# Patient Record
Sex: Male | Born: 1955 | Race: White | Hispanic: No | Marital: Married | Smoking: Never smoker
Health system: Southern US, Community
[De-identification: ages and names within clinical notes are randomized; demographics above are authoritative.]

## PROBLEM LIST (undated history)

## (undated) DIAGNOSIS — I2510 Atherosclerotic heart disease of native coronary artery without angina pectoris: Secondary | ICD-10-CM

## (undated) DIAGNOSIS — E785 Hyperlipidemia, unspecified: Secondary | ICD-10-CM

## (undated) DIAGNOSIS — I255 Ischemic cardiomyopathy: Secondary | ICD-10-CM

## (undated) DIAGNOSIS — I2109 ST elevation (STEMI) myocardial infarction involving other coronary artery of anterior wall: Secondary | ICD-10-CM

## (undated) DIAGNOSIS — H409 Unspecified glaucoma: Secondary | ICD-10-CM

## (undated) HISTORY — DX: Unspecified glaucoma: H40.9

## (undated) HISTORY — PX: HAND SURGERY: SHX662

## (undated) HISTORY — PX: CARDIAC CATHETERIZATION: SHX172

---
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
## 2014-10-04 ENCOUNTER — Ambulatory Visit (INDEPENDENT_AMBULATORY_CARE_PROVIDER_SITE_OTHER): Payer: BC Managed Care – PPO

## 2014-10-04 VITALS — BP 127/78 | HR 71 | Resp 18

## 2014-10-04 DIAGNOSIS — R52 Pain, unspecified: Secondary | ICD-10-CM

## 2014-10-04 DIAGNOSIS — M722 Plantar fascial fibromatosis: Secondary | ICD-10-CM

## 2014-10-04 DIAGNOSIS — M76821 Posterior tibial tendinitis, right leg: Secondary | ICD-10-CM | POA: Diagnosis not present

## 2014-10-04 DIAGNOSIS — M7731 Calcaneal spur, right foot: Secondary | ICD-10-CM

## 2014-10-04 NOTE — Progress Notes (Signed)
   Subjective:    Patient ID: Samuel [name], male    DOB: 20-Jan-1956, 59 y.o.   MRN: [id number]  HPI i am having some pain in my arch and on bottom of my right heel and has been going on for about a year and throbs and is sore on the bottom some     Review of Systems  HENT: Positive for sinus pressure.   Skin:       Change in nails  All other systems reviewed and are negative.      Objective:   Physical Exam 59 year old white male well-developed well-nourished oriented 3 presents this time with approximately one-year history of foot and arch pain right more severe than left both feet feel painful at times has severe promontory changes with flattening of the foot there is bony prominence of the navicular medially right more so than left. Mild functional limitus of the hallux due to premature he changes in an flattening of the arch on weightbearing. There is pain tenderness on palpation of the medial band plantar fascia and mid arch area right foot from mid arch to calcaneal tubercle there is some tenderness on palpation directly over the navicular itself at the posterior tibial tendon insertion of the navicular. No open wounds no ulcers no other abnormalities masker status is intact pedal pulses are palpable DP and PT +2 over 4 Refill time 3 seconds all digits epicritic and proprioceptive sensations intact and symmetric there is normal plantar response and DTRs. Rays reveal minimal inferior Heel spurring fascial thickening is noted on lateral projection prepped or changes noted clinically and radiographically with anterior break in the cyma line being noted as well on x-ray.       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 Patient may also have some mild posterior tibial tendinitis and capsulitis of the end of the talonavicular joint. Plan at this time patient is placed in a fascial  strapping her right foot both for a trial evaluation for possible use of orthoses in the future. Maintain strapping for 5 days use Tylenol or Advil as needed for pain patient is wearing OTC Dr. Jabier MuttonScholl insoles in his shoes however these are mostly cushion no significant support is been noted indicates he feels better when he's and a firm soled shoe like his carbon fiber bicycle shoe. This is likely due to the support provided by the shoes. Fascial strapping applied reevaluate within 2 weeks may be candidate for orthoses based on progress  Alvan Dameichard Italo Banton DPM

## 2014-10-04 NOTE — Patient Instructions (Signed)
ICE INSTRUCTIONS  Apply ice or cold pack to the affected area at least 3 times a day for 10-15 minutes each time.  You should also use ice after prolonged activity or vigorous exercise.  Do not apply ice longer than 20 minutes at one time.  Always keep a cloth between your skin and the ice pack to prevent burns.  Being consistent and following these instructions will help control your symptoms.  We suggest you purchase a gel ice pack because they are reusable and do bit leak.  Some of them are designed to wrap around the area.  Use the method that works best for you.  Here are some other suggestions for icing.   Use a frozen bag of peas or corn-inexpensive and molds well to your body, usually stays frozen for 10 to 20 minutes.  Wet a towel with cold water and squeeze out the excess until it's damp.  Place in a bag in the freezer for 20 minutes. Then remove and use.  For pain consider using Tylenol or ibuprofen as needed for any pain    Plantar Fasciitis Plantar fasciitis is a common condition that causes foot pain. It is soreness (inflammation) of the band of tough fibrous tissue on the bottom of the foot that runs from the heel bone (calcaneus) to the ball of the foot. The cause of this soreness may be from excessive standing, poor fitting shoes, running on hard surfaces, being overweight, having an abnormal walk, or overuse (this is common in runners) of the painful foot or feet. It is also common in aerobic exercise dancers and ballet dancers. SYMPTOMS  Most people with plantar fasciitis complain of:  Severe pain in the morning on the bottom of their foot especially when taking the first steps out of bed. This pain recedes after a few minutes of walking.  Severe pain is experienced also during walking following a long period of inactivity.  Pain is worse when walking barefoot or up stairs DIAGNOSIS  3. Your caregiver will diagnose this condition by examining and feeling your  foot. 4. Special tests such as X-rays of your foot, are usually not needed. PREVENTION   Consult a sports medicine professional before beginning a new exercise program.  Walking programs offer a good workout. With walking there is a lower chance of overuse injuries common to runners. There is less impact and less jarring of the joints.  Begin all new exercise programs slowly. If problems or pain develop, decrease the amount of time or distance until you are at a comfortable level.  Wear good shoes and replace them regularly.  Stretch your foot and the heel cords at the back of the ankle (Achilles tendon) both before and after exercise.  Run or exercise on even surfaces that are not hard. For example, asphalt is better than pavement.  Do not run barefoot on hard surfaces.  If using a treadmill, vary the incline.  Do not continue to workout if you have foot or joint problems. Seek professional help if they do not improve. HOME CARE INSTRUCTIONS   Avoid activities that cause you pain until you recover.  Use ice or cold packs on the problem or painful areas after working out.  Only take over-the-counter or prescription medicines for pain, discomfort, or fever as directed by your caregiver.  Soft shoe inserts or athletic shoes with air or gel sole cushions may be helpful.  If problems continue or become more severe, consult a sports medicine caregiver or  your own health care provider. Cortisone is a potent anti-inflammatory medication that may be injected into the painful area. You can discuss this treatment with your caregiver. MAKE SURE YOU:   Understand these instructions.  Will watch your condition.  Will get help right away if you are not doing well or get worse. Document Released: 03/24/2001 Document Revised: 09/21/2011 Document Reviewed: 05/23/2008 [facility] Patient Information 2015 [place] [place]. This information is not intended to replace advice given to you by your  health care provider. Make sure you discuss any questions you have with your health care provider.

## 2014-10-22 ENCOUNTER — Ambulatory Visit: Payer: BC Managed Care – PPO

## 2014-10-24 ENCOUNTER — Ambulatory Visit: Payer: BC Managed Care – PPO | Admitting: Podiatry

## 2016-05-07 ENCOUNTER — Ambulatory Visit: Payer: Self-pay | Admitting: Licensed Clinical Social Worker

## 2019-02-12 ENCOUNTER — Other Ambulatory Visit: Payer: Self-pay

## 2019-02-12 ENCOUNTER — Encounter (HOSPITAL_COMMUNITY)
Admission: EM | Disposition: A | Discharge status: Home / Self Care | Payer: Self-pay | Source: Home / Self Care | Attending: Cardiology

## 2019-02-12 ENCOUNTER — Inpatient Hospital Stay (HOSPITAL_COMMUNITY)
Admission: EM | Admit: 2019-02-12 | Discharge: 2019-02-14 | DRG: 247 | Disposition: A | Discharge status: Home / Self Care | Payer: BC Managed Care – PPO | Attending: Cardiology | Admitting: Cardiology

## 2019-02-12 ENCOUNTER — Inpatient Hospital Stay (HOSPITAL_COMMUNITY): Payer: BC Managed Care – PPO

## 2019-02-12 ENCOUNTER — Encounter (HOSPITAL_COMMUNITY): Payer: Self-pay | Admitting: Cardiology

## 2019-02-12 DIAGNOSIS — I255 Ischemic cardiomyopathy: Secondary | ICD-10-CM | POA: Diagnosis present

## 2019-02-12 DIAGNOSIS — Z8249 Family history of ischemic heart disease and other diseases of the circulatory system: Secondary | ICD-10-CM

## 2019-02-12 DIAGNOSIS — Z79899 Other long term (current) drug therapy: Secondary | ICD-10-CM | POA: Diagnosis not present

## 2019-02-12 DIAGNOSIS — I2510 Atherosclerotic heart disease of native coronary artery without angina pectoris: Secondary | ICD-10-CM | POA: Diagnosis not present

## 2019-02-12 DIAGNOSIS — I2109 ST elevation (STEMI) myocardial infarction involving other coronary artery of anterior wall: Secondary | ICD-10-CM

## 2019-02-12 DIAGNOSIS — Z20828 Contact with and (suspected) exposure to other viral communicable diseases: Secondary | ICD-10-CM | POA: Diagnosis present

## 2019-02-12 DIAGNOSIS — I2102 ST elevation (STEMI) myocardial infarction involving left anterior descending coronary artery: Secondary | ICD-10-CM

## 2019-02-12 DIAGNOSIS — N179 Acute kidney failure, unspecified: Secondary | ICD-10-CM | POA: Diagnosis present

## 2019-02-12 DIAGNOSIS — E785 Hyperlipidemia, unspecified: Secondary | ICD-10-CM

## 2019-02-12 HISTORY — DX: Atherosclerotic heart disease of native coronary artery without angina pectoris: I25.10

## 2019-02-12 HISTORY — PX: LEFT HEART CATH AND CORONARY ANGIOGRAPHY: CATH118249

## 2019-02-12 HISTORY — DX: ST elevation (STEMI) myocardial infarction involving other coronary artery of anterior wall: I21.09

## 2019-02-12 HISTORY — PX: CORONARY/GRAFT ACUTE MI REVASCULARIZATION: CATH118305

## 2019-02-12 HISTORY — DX: Hyperlipidemia, unspecified: E78.5

## 2019-02-12 HISTORY — DX: ST elevation (STEMI) myocardial infarction involving left anterior descending coronary artery: I21.02

## 2019-02-12 HISTORY — DX: Ischemic cardiomyopathy: I25.5

## 2019-02-12 LAB — CBC WITH DIFFERENTIAL/PLATELET
Abs Immature Granulocytes: 0.04 10*3/uL (ref 0.00–0.07)
Basophils Absolute: 0 10*3/uL (ref 0.0–0.1)
Basophils Relative: 0 %
Eosinophils Absolute: 0 10*3/uL (ref 0.0–0.5)
Eosinophils Relative: 0 %
HCT: 41.3 % (ref 39.0–52.0)
Hemoglobin: 14.3 g/dL (ref 13.0–17.0)
Immature Granulocytes: 0 %
Lymphocytes Relative: 13 %
Lymphs Abs: 1.9 10*3/uL (ref 0.7–4.0)
MCH: 33.4 pg (ref 26.0–34.0)
MCHC: 34.6 g/dL (ref 30.0–36.0)
MCV: 96.5 fL (ref 80.0–100.0)
Monocytes Absolute: 0.8 10*3/uL (ref 0.1–1.0)
Monocytes Relative: 6 %
Neutro Abs: 11.3 10*3/uL — ABNORMAL HIGH (ref 1.7–7.7)
Neutrophils Relative %: 81 %
Platelets: 286 10*3/uL (ref 150–400)
RBC: 4.28 MIL/uL (ref 4.22–5.81)
RDW: 12.2 % (ref 11.5–15.5)
WBC: 14.1 10*3/uL — ABNORMAL HIGH (ref 4.0–10.5)
nRBC: 0 % (ref 0.0–0.2)

## 2019-02-12 LAB — I-STAT CHEM 8, ED
BUN: 27 mg/dL — ABNORMAL HIGH (ref 8–23)
Calcium, Ion: 1.17 mmol/L (ref 1.15–1.40)
Chloride: 108 mmol/L (ref 98–111)
Creatinine, Ser: 1.3 mg/dL — ABNORMAL HIGH (ref 0.61–1.24)
Glucose, Bld: 170 mg/dL — ABNORMAL HIGH (ref 70–99)
HCT: 41 % (ref 39.0–52.0)
Hemoglobin: 13.9 g/dL (ref 13.0–17.0)
Potassium: 4.6 mmol/L (ref 3.5–5.1)
Sodium: 141 mmol/L (ref 135–145)
TCO2: 20 mmol/L — ABNORMAL LOW (ref 22–32)

## 2019-02-12 LAB — CBC
HCT: 38.4 % — ABNORMAL LOW (ref 39.0–52.0)
Hemoglobin: 13.2 g/dL (ref 13.0–17.0)
MCH: 33.4 pg (ref 26.0–34.0)
MCHC: 34.4 g/dL (ref 30.0–36.0)
MCV: 97.2 fL (ref 80.0–100.0)
Platelets: 220 10*3/uL (ref 150–400)
RBC: 3.95 MIL/uL — ABNORMAL LOW (ref 4.22–5.81)
RDW: 12.2 % (ref 11.5–15.5)
WBC: 17 10*3/uL — ABNORMAL HIGH (ref 4.0–10.5)
nRBC: 0 % (ref 0.0–0.2)

## 2019-02-12 LAB — COMPREHENSIVE METABOLIC PANEL
ALT: 22 U/L (ref 0–44)
AST: 26 U/L (ref 15–41)
Albumin: 4.2 g/dL (ref 3.5–5.0)
Alkaline Phosphatase: 51 U/L (ref 38–126)
Anion gap: 14 (ref 5–15)
BUN: 26 mg/dL — ABNORMAL HIGH (ref 8–23)
CO2: 19 mmol/L — ABNORMAL LOW (ref 22–32)
Calcium: 9.9 mg/dL (ref 8.9–10.3)
Chloride: 106 mmol/L (ref 98–111)
Creatinine, Ser: 1.46 mg/dL — ABNORMAL HIGH (ref 0.61–1.24)
GFR calc Af Amer: 59 mL/min — ABNORMAL LOW (ref >60)
GFR calc non Af Amer: 51 mL/min — ABNORMAL LOW (ref >60)
Glucose, Bld: 175 mg/dL — ABNORMAL HIGH (ref 70–99)
Potassium: 4.7 mmol/L (ref 3.5–5.1)
Sodium: 139 mmol/L (ref 135–145)
Total Bilirubin: 0.7 mg/dL (ref 0.3–1.2)
Total Protein: 7.7 g/dL (ref 6.5–8.1)

## 2019-02-12 LAB — LIPID PANEL
Cholesterol: 162 mg/dL (ref 0–200)
HDL: 55 mg/dL (ref >40)
LDL Cholesterol: 83 mg/dL (ref 0–99)
Total CHOL/HDL Ratio: 2.9 RATIO
Triglycerides: 120 mg/dL (ref <150)
VLDL: 24 mg/dL (ref 0–40)

## 2019-02-12 LAB — TROPONIN I (HIGH SENSITIVITY)
Troponin I (High Sensitivity): 19 ng/L — ABNORMAL HIGH (ref <18)
Troponin I (High Sensitivity): 517 ng/L (ref <18)
Troponin I (High Sensitivity): 2359 ng/L (ref <18)
Troponin I (High Sensitivity): 7269 ng/L (ref <18)
Troponin I (High Sensitivity): 12648 ng/L (ref <18)

## 2019-02-12 LAB — MRSA PCR SCREENING: MRSA by PCR: NEGATIVE

## 2019-02-12 LAB — PROTIME-INR
INR: 1.1 (ref 0.8–1.2)
Prothrombin Time: 14.2 seconds (ref 11.4–15.2)

## 2019-02-12 LAB — CREATININE, SERUM
Creatinine, Ser: 1.35 mg/dL — ABNORMAL HIGH (ref 0.61–1.24)
GFR calc Af Amer: >60 mL/min (ref >60)
GFR calc non Af Amer: 56 mL/min — ABNORMAL LOW (ref >60)

## 2019-02-12 LAB — SARS CORONAVIRUS 2 BY RT PCR (HOSPITAL ORDER, PERFORMED IN ~~LOC~~ HOSPITAL LAB): SARS Coronavirus 2: NEGATIVE

## 2019-02-12 LAB — APTT: aPTT: 23 seconds — ABNORMAL LOW (ref 24–36)

## 2019-02-12 SURGERY — CORONARY/GRAFT ACUTE MI REVASCULARIZATION
Anesthesia: LOCAL

## 2019-02-12 MED ORDER — SODIUM CHLORIDE 0.9 % IV SOLN: 250.0000 mL | INTRAVENOUS | Status: DC | PRN

## 2019-02-12 MED ORDER — CHLORHEXIDINE GLUCONATE CLOTH 2 % EX PADS
6.0000 | MEDICATED_PAD | Freq: Every day | CUTANEOUS | Status: DC
  Administered 2019-02-13: 6 via TOPICAL

## 2019-02-12 MED ORDER — SODIUM CHLORIDE 0.9 % IV SOLN
INTRAVENOUS | Status: DC | PRN
  Administered 2019-02-12: 10 mL/h via INTRAVENOUS
  Administered 2019-02-12: 250 mL

## 2019-02-12 MED ORDER — SODIUM CHLORIDE 0.9% FLUSH: 3.0000 mL | INTRAVENOUS | Status: DC | PRN

## 2019-02-12 MED ORDER — CANGRELOR TETRASODIUM 50 MG IV SOLR
INTRAVENOUS | Status: AC
  Filled 2019-02-12: qty 50

## 2019-02-12 MED ORDER — SODIUM CHLORIDE 0.9 % IV SOLN: INTRAVENOUS | Status: DC

## 2019-02-12 MED ORDER — MIDAZOLAM HCL 2 MG/2ML IJ SOLN
INTRAMUSCULAR | Status: DC | PRN
  Administered 2019-02-12: 0.5 mg via INTRAVENOUS

## 2019-02-12 MED ORDER — MORPHINE SULFATE (PF) 2 MG/ML IV SOLN: 2.0000 mg | INTRAVENOUS | Status: DC | PRN

## 2019-02-12 MED ORDER — MORPHINE SULFATE (PF) 10 MG/ML IV SOLN: 2.0000 mg | INTRAVENOUS | Status: DC | PRN

## 2019-02-12 MED ORDER — ONDANSETRON HCL 4 MG/2ML IJ SOLN: 4.0000 mg | Freq: Four times a day (QID) | INTRAMUSCULAR | Status: DC | PRN

## 2019-02-12 MED ORDER — TICAGRELOR 90 MG PO TABS
ORAL_TABLET | ORAL | Status: DC | PRN
  Administered 2019-02-12: 180 mg via ORAL

## 2019-02-12 MED ORDER — TICAGRELOR 90 MG PO TABS
90.0000 mg | ORAL_TABLET | Freq: Two times a day (BID) | ORAL | Status: DC
  Administered 2019-02-12 – 2019-02-14 (×4): 90 mg via ORAL
  Filled 2019-02-12 (×3): qty 1

## 2019-02-12 MED ORDER — SODIUM CHLORIDE 0.9 % IV SOLN
INTRAVENOUS | Status: AC | PRN
  Administered 2019-02-12: 13:00:00 4 ug/kg/min via INTRAVENOUS

## 2019-02-12 MED ORDER — HEPARIN (PORCINE) IN NACL 1000-0.9 UT/500ML-% IV SOLN
INTRAVENOUS | Status: DC | PRN
  Administered 2019-02-12 (×2): 500 mL

## 2019-02-12 MED ORDER — ATORVASTATIN CALCIUM 80 MG PO TABS
80.0000 mg | ORAL_TABLET | Freq: Every day | ORAL | Status: DC
  Administered 2019-02-12 – 2019-02-14 (×3): 80 mg via ORAL
  Filled 2019-02-12 (×3): qty 1

## 2019-02-12 MED ORDER — LIDOCAINE HCL (PF) 1 % IJ SOLN
INTRAMUSCULAR | Status: DC | PRN
  Administered 2019-02-12: 2 mL via SUBCUTANEOUS

## 2019-02-12 MED ORDER — VERAPAMIL HCL 2.5 MG/ML IV SOLN
INTRAVENOUS | Status: DC | PRN
  Administered 2019-02-12: 10 mL via INTRA_ARTERIAL

## 2019-02-12 MED ORDER — NITROGLYCERIN 1 MG/10 ML FOR IR/CATH LAB
INTRA_ARTERIAL | Status: DC | PRN
  Administered 2019-02-12: 200 ug via INTRACORONARY

## 2019-02-12 MED ORDER — ACETAMINOPHEN 325 MG PO TABS: 650.0000 mg | ORAL_TABLET | ORAL | Status: DC | PRN

## 2019-02-12 MED ORDER — SODIUM CHLORIDE 0.9 % IV SOLN
4.0000 ug/kg/min | INTRAVENOUS | Status: AC
  Filled 2019-02-12: qty 50

## 2019-02-12 MED ORDER — LIDOCAINE HCL (PF) 1 % IJ SOLN
INTRAMUSCULAR | Status: AC
  Filled 2019-02-12: qty 30

## 2019-02-12 MED ORDER — NITROGLYCERIN 0.4 MG SL SUBL
0.4000 mg | SUBLINGUAL_TABLET | SUBLINGUAL | Status: DC | PRN
  Administered 2019-02-12: 0.4 mg via SUBLINGUAL
  Filled 2019-02-12: qty 1

## 2019-02-12 MED ORDER — HEPARIN SODIUM (PORCINE) 1000 UNIT/ML IJ SOLN
INTRAMUSCULAR | Status: DC | PRN
  Administered 2019-02-12: 3000 [IU] via INTRAVENOUS
  Administered 2019-02-12: 4000 [IU] via INTRAVENOUS
  Administered 2019-02-12: 2000 [IU] via INTRAVENOUS

## 2019-02-12 MED ORDER — HYDROMORPHONE HCL 1 MG/ML IJ SOLN
0.5000 mg | Freq: Once | INTRAMUSCULAR | Status: AC
  Administered 2019-02-12: 0.5 mg via INTRAVENOUS
  Filled 2019-02-12: qty 1

## 2019-02-12 MED ORDER — TICAGRELOR 90 MG PO TABS
ORAL_TABLET | ORAL | Status: AC
  Filled 2019-02-12: qty 2

## 2019-02-12 MED ORDER — MIDAZOLAM HCL 2 MG/2ML IJ SOLN
INTRAMUSCULAR | Status: AC
  Filled 2019-02-12: qty 2

## 2019-02-12 MED ORDER — CANGRELOR BOLUS VIA INFUSION
INTRAVENOUS | Status: DC | PRN
  Administered 2019-02-12: 2124 ug via INTRAVENOUS

## 2019-02-12 MED ORDER — HEPARIN SODIUM (PORCINE) 1000 UNIT/ML IJ SOLN
INTRAMUSCULAR | Status: AC
  Filled 2019-02-12: qty 1

## 2019-02-12 MED ORDER — HEPARIN (PORCINE) IN NACL 1000-0.9 UT/500ML-% IV SOLN
INTRAVENOUS | Status: AC
  Filled 2019-02-12: qty 1000

## 2019-02-12 MED ORDER — SODIUM CHLORIDE 0.9% FLUSH
3.0000 mL | Freq: Two times a day (BID) | INTRAVENOUS | Status: DC
  Administered 2019-02-12 – 2019-02-14 (×4): 3 mL via INTRAVENOUS

## 2019-02-12 MED ORDER — SODIUM CHLORIDE 0.9 % IV SOLN
INTRAVENOUS | Status: AC
  Administered 2019-02-12: 14:00:00 via INTRAVENOUS

## 2019-02-12 MED ORDER — ASPIRIN 81 MG PO CHEW
81.0000 mg | CHEWABLE_TABLET | Freq: Every day | ORAL | Status: DC
  Administered 2019-02-13 – 2019-02-14 (×2): 81 mg via ORAL
  Filled 2019-02-12 (×2): qty 1

## 2019-02-12 MED ORDER — HEPARIN SODIUM (PORCINE) 5000 UNIT/ML IJ SOLN
60.0000 [IU]/kg | Freq: Once | INTRAMUSCULAR | Status: AC
  Administered 2019-02-12: 12:00:00 4000 [IU] via INTRAVENOUS

## 2019-02-12 MED ORDER — IOHEXOL 350 MG/ML SOLN
INTRAVENOUS | Status: DC | PRN
  Administered 2019-02-12: 110 mL via INTRA_ARTERIAL

## 2019-02-12 MED ORDER — METOPROLOL TARTRATE 12.5 MG HALF TABLET
12.5000 mg | ORAL_TABLET | Freq: Two times a day (BID) | ORAL | Status: DC
  Administered 2019-02-12 – 2019-02-14 (×5): 12.5 mg via ORAL
  Filled 2019-02-12 (×5): qty 1

## 2019-02-12 MED ORDER — HEPARIN SODIUM (PORCINE) 5000 UNIT/ML IJ SOLN
5000.0000 [IU] | Freq: Three times a day (TID) | INTRAMUSCULAR | Status: DC
  Administered 2019-02-13 – 2019-02-14 (×5): 5000 [IU] via SUBCUTANEOUS
  Filled 2019-02-12 (×5): qty 1

## 2019-02-12 MED ORDER — HYDRALAZINE HCL 20 MG/ML IJ SOLN: 10.0000 mg | INTRAMUSCULAR | Status: AC | PRN

## 2019-02-12 MED ORDER — NITROGLYCERIN 1 MG/10 ML FOR IR/CATH LAB
INTRA_ARTERIAL | Status: AC
  Filled 2019-02-12: qty 10

## 2019-02-12 MED ORDER — LABETALOL HCL 5 MG/ML IV SOLN: 10.0000 mg | INTRAVENOUS | Status: AC | PRN

## 2019-02-12 MED ORDER — VERAPAMIL HCL 2.5 MG/ML IV SOLN
INTRAVENOUS | Status: AC
  Filled 2019-02-12: qty 2

## 2019-02-12 SURGICAL SUPPLY — 17 items
BAG SNAP BAND KOVER 36X36 (MISCELLANEOUS) ×2 IMPLANT
BALLN SAPPHIRE 2.5X15 (BALLOONS) ×2
BALLOON SAPPHIRE 2.5X15 (BALLOONS) ×1 IMPLANT
CATH INFINITI 5FR ANG PIGTAIL (CATHETERS) ×2 IMPLANT
CATH OPTITORQUE TIG 4.0 5F (CATHETERS) ×2 IMPLANT
CATH VISTA GUIDE 6FR XBLAD3.5 (CATHETERS) ×2 IMPLANT
DEVICE RAD COMP TR BAND LRG (VASCULAR PRODUCTS) ×2 IMPLANT
GLIDESHEATH SLEND A-KIT 6F 22G (SHEATH) ×2 IMPLANT
GUIDEWIRE INQWIRE 1.5J.035X260 (WIRE) ×1 IMPLANT
INQWIRE 1.5J .035X260CM (WIRE) ×2
KIT ENCORE 26 ADVANTAGE (KITS) ×2 IMPLANT
KIT HEART LEFT (KITS) ×2 IMPLANT
PACK CARDIAC CATHETERIZATION (CUSTOM PROCEDURE TRAY) ×2 IMPLANT
STENT RESOLUTE ONYX 2.75X18 (Permanent Stent) ×2 IMPLANT
TRANSDUCER W/STOPCOCK (MISCELLANEOUS) ×2 IMPLANT
TUBING CIL FLEX 10 FLL-RA (TUBING) ×2 IMPLANT
WIRE ASAHI PROWATER 180CM (WIRE) ×2 IMPLANT

## 2019-02-12 NOTE — H&P (Signed)
Cardiology Admission History and Physical:   Patient ID: Samuel [name] MRN: [id number]; DOB: 1955-10-02   Admission date: 02/12/2019  Primary Care Provider: No primary care provider on file. Primary Cardiologist: No primary care provider on file.  New to see [facility] Primary Electrophysiologist:  None   Chief Complaint: Chest pain-anterior STEMI  Patient Profile:   Samuel [name] is a 63 y.o. male with history of relatively controlled hyperlipidemia for which he takes a low-dose of a statin.  [facility].  History of Present Illness:   Samuel [name] was in his usual state of health having just returned from a 30 mile bike ride (he takes his rides at least twice a week).  Shortly after getting home he developed crushing 8-9/10 chest discomfort and tightness in the left upper chest associated with some diaphoresis and dyspnea.  Within 15 minutes he contacted EMS and upon arrival EKG demonstrated sinus tachycardia at 104 bpm with 3 mm elations in V2 was well as seven 8 mm ST lesions in V2 through V4 and less pronounced elevations in I, aVL as well as V2 by 3 V6.  Mild reciprocal changes noted in III and aVF. Code STEMI was called. The patient received aspirin in route to the hospital.  Chest discomfort was 7 out of 10 on arrival.  In the ER he received 4000 units of IV heparin, 0.5 mg of IV Dilaudid and 0.4 mg sublingual nitroglycerin.  Pain was still roughly 6/10 upon arrival to the Cath Lab.  The patient is practicing social distancing.  COVID-19 (SARS 2 CORONAVIRUS) swab was performed in the ER  Heart Pathway Score:     No past medical history on file.  Past Surgical History:  Procedure Laterality Date  . HAND SURGERY     right     Medications Prior to Admission: Prior to Admission medications   Medication Sig Start Date End Date Taking? Authorizing Provider  ADVICOR 500-20 MG 24 hr tablet  08/09/14   [provider]  Fexofenadine HCl (ALLEGRA PO) Take by  mouth.    [provider]  Fexofenadine HCl (MUCINEX ALLERGY PO) Take by mouth.    [provider]  itraconazole (SPORANOX) 100 MG capsule  09/18/14   [provider]     Allergies:    Allergies  Allergen Reactions  . Sulfa Antibiotics     Social History:   Social History   Tobacco Use  . Smoking status: Never Smoker  . Smokeless tobacco: Never Used  Substance Use Topics  . Alcohol use: No    Alcohol/week: 0.0 standard drinks  . Drug use: No   Social History   Social History Narrative   Married   Patient is very active, he walks usually 2 to 4 miles a day, and bikes 20 to 30 miles 2 times a week.   Non-smoker     Family History: Details of his history fully known, however his father and at least 2 paternal uncles had premature cardiac disease. The patient's family history includes CAD in his father and paternal uncle.    ROS:  Please see the history of present illness.  Prior to today, he had no symptoms of chest pain chest pressure or dyspnea.  No coughing or wheezing.  No lightheadedness, dizziness or wooziness.  No syncope/near syncope.  No TIA or amaurosis fugax. The patient does not have symptoms concerning for COVID-19 infection (fever, chills, cough, or new shortness of breath).  All other ROS reviewed and negative.     Physical Exam/Data:   Vitals:   02/12/19 1305 02/12/19 1310 02/12/19 1315 02/12/19 1320  BP: 103/69 112/62 (!) 102/59 (!) 100/57  Pulse: 78 63 74 78  Resp: 16 19 20 19   Temp:      TempSrc:      SpO2: 96% 97% 97% 100%  Weight:      Height:       No intake or output data in the 24 hours ending 02/12/19 1329 Last 3 Weights 02/12/2019  Weight (lbs) 156 lb  Weight (kg) 70.761 kg     Body mass index is 24.43 kg/m.  General:  Well nourished, well developed, in moderate distress HEENT: normal Lymph: no adenopathy Neck: no JVD or carotid bruit Endocrine:  No thryomegaly Vascular: No carotid bruits; FA pulses 2+  bilaterally without bruits  Cardiac:  normal S1, S2; RRR; no murmur or rubs, cannot exclude S4 gallop Lungs:  clear to auscultation bilaterally, no wheezing, rhonchi or rales  Abd: soft, nontender, no hepatomegaly  Ext: no clubbing/cyanosis or edema Musculoskeletal:  No deformities, BUE and BLE strength normal and equal Skin: warm and dry  Neuro:  CNs 2-12 intact, no focal abnormalities noted Psych:  Normal affect    EKG:  The ECG that was done via EMS was personally reviewed and demonstrates sinus tachycardia rate 100-104 with 6 to 8 mm ST elevations in V2 through V4 with less prominent ST elevations in V1, V5 and 6 as well as 8 criminals 1 and aVL.  Depressions in III and aVF. -->  Consistent with anterior STEMI  Relevant CV Studies: None  Laboratory Data:  High Sensitivity Troponin:  No results for input(s): TROPONINIHS in the last 720 hours.    Cardiac EnzymesNo results for input(s): TROPONINI in the last 168 hours. No results for input(s): TROPIPOC in the last 168 hours.  Chemistry Recent Labs  Lab 02/12/19 1233  NA 141  K 4.6  CL 108  GLUCOSE 170*  BUN 27*  CREATININE 1.30*    No results for input(s): PROT, ALBUMIN, AST, ALT, ALKPHOS, BILITOT in the last 168 hours. Hematology Recent Labs  Lab 02/12/19 1231 02/12/19 1233  WBC 14.1*  --   RBC 4.28  --   HGB 14.3 13.9  HCT 41.3 41.0  MCV 96.5  --   MCH 33.4  --   MCHC 34.6  --   RDW 12.2  --   PLT 286  --    BNPNo results for input(s): BNP, PROBNP in the last 168 hours.  DDimer No results for input(s): DDIMER in the last 168 hours.   Radiology/Studies:  No results found.  Assessment and Plan:   Principal Problem:   Acute ST elevation myocardial infarction (STEMI) of anterior wall (HCC) Active Problems:   Hyperlipidemia with target LDL less than 70  With acute onset chest pain 1 hour prior to arrival to the emergency room and ongoing chest pain will take directly to Cath Lab for emergent cardiac  catheterization and possible PCI.  We will likely start beta-blocker and continue statin at higher dose.  Further plans based on results of cath.  Severity of Illness: The appropriate patient status for this patient is INPATIENT. Inpatient status is judged to be reasonable and necessary in order to provide the required intensity of service to ensure the patient's safety. The patient's presenting symptoms, physical exam findings, and initial radiographic and laboratory data in the context of their chronic comorbidities  is felt to place them at high risk for further clinical deterioration. Furthermore, it is not anticipated that the patient will be medically stable for discharge from the hospital within 2 midnights of admission. The following factors support the patient status of inpatient.   " The patient's presenting symptoms include significant chest pain with diaphoresis and dyspnea. " The worrisome physical exam findings include - appears to be in moderate distress.Reubin Milan exclude S4 " The initial radiographic and laboratory data are worrisome because of EKG suggestive of ST elevations in anterior leads consistent with anterior MI. " The chronic co-morbidities include hyperlipidemia.   * I certify that at the point of admission it is my clinical judgment that the patient will require inpatient hospital care spanning beyond 2 midnights from the point of admission due to high intensity of service, high risk for further deterioration and high frequency of surveillance required.*    For questions or updates, please contact [place] Please consult [email or web address] for contact info under        Signed, Glenetta Hew, MD  02/12/2019 1:29 PM

## 2019-02-12 NOTE — ED Notes (Signed)
4000 heparin bolus given

## 2019-02-12 NOTE — Progress Notes (Signed)
CRITICAL VALUE ALERT  Critical Value:  Troponin 517  Date & Time Notied: 02/12/2019 @ 1510  Provider Notified: NONE  Orders Received/Actions taken: NONE: these results were expected.

## 2019-02-12 NOTE — ED Provider Notes (Signed)
[facility] Provider Note MRN:  [id number]  Arrival date & time: 02/12/19     Chief Complaint   Code STEMI   History of Present Illness   Samuel [name] is a 63 y.o. year-old male with a history of hyperlipidemia presenting to the ED with chief complaint of code STEMI.  Patient had just returned home from a bike ride when he experienced sudden onset left-sided chest pain, described as a pressure, 8 out of 10 in severity, constant.  Associated with diaphoresis, mild shortness of breath.  EKG in the field read as STEMI by EMS, alerted prior to arrival.  Review of Systems  A complete 10 system review of systems was obtained and all systems are negative except as noted in the HPI and PMH.   Patient's Health History   No past medical history on file.  Past Surgical History:  Procedure Laterality Date  . HAND SURGERY     right    Family History  Problem Relation Age of Onset  . CAD Father 5750       Ended up with CABG  . CAD Paternal Uncle     Social History   Socioeconomic History  . Marital status: Unknown    Spouse name: Not on file  . Number of children: Not on file  . Years of education: Not on file  . Highest education level: Not on file  Occupational History  . Not on file  Social Needs  . Financial resource strain: Not on file  . Food insecurity    Worry: Not on file    Inability: Not on file  . Transportation needs    Medical: Not on file    Non-medical: Not on file  Tobacco Use  . Smoking status: Never Smoker  . Smokeless tobacco: Never Used  Substance and Sexual Activity  . Alcohol use: No    Alcohol/week: 0.0 standard drinks  . Drug use: No  . Sexual activity: Not on file  Lifestyle  . Physical activity    Days per week: Not on file    Minutes per session: Not on file  . Stress: Not on file  Relationships  . Social Musicianconnections    Talks on phone: Not on file    Gets together: Not on file    Attends religious  service: Not on file    Active member of club or organization: Not on file    Attends meetings of clubs or organizations: Not on file    Relationship status: Not on file  . Intimate partner violence    Fear of current or ex partner: Not on file    Emotionally abused: Not on file    Physically abused: Not on file    Forced sexual activity: Not on file  Other Topics Concern  . Not on file  Social History Narrative   Married   Patient is very active, he walks usually 2 to 4 miles a day, and bikes 20 to 30 miles 2 times a week.   Non-smoker     Physical Exam  Vital Signs and Nursing Notes reviewed Vitals:   02/12/19 1315 02/12/19 1320  BP: (!) 102/59 (!) 100/57  Pulse: 74 78  Resp: 20 19  Temp:    SpO2: 97% 100%    CONSTITUTIONAL: Ill-appearing, diaphoretic, in mild distress due to pain NEURO:  Alert and oriented x 3, no focal deficits EYES:  eyes equal and reactive ENT/NECK:  no LAD, no JVD  CARDIO: Regular rate, well-perfused, normal S1 and S2 PULM:  CTAB no wheezing or rhonchi GI/GU:  normal bowel sounds, non-distended, non-tender MSK/SPINE:  No gross deformities, no edema SKIN:  no rash, atraumatic PSYCH:  Appropriate speech and behavior  Diagnostic and Interventional Summary    EKG Interpretation  Date/Time:  Sunday February 12 2019 12:26:17 EDT Ventricular Rate:  71 PR Interval:    QRS Duration: 106 QT Interval:  429 QTC Calculation: 467 R Axis:   65 Text Interpretation:  Sinus rhythm Probable left atrial enlargement Low voltage, precordial leads Borderline ST elevation, anterior leads Confirmed by Kennis CarinaBero, Danisha Brassfield [phone]([id number]) on 02/12/2019 1:29:09 PM      Labs Reviewed  CBC WITH DIFFERENTIAL/PLATELET - Abnormal; Notable for the following components:      Result Value   WBC 14.1 (*)    Neutro Abs 11.3 (*)    All other components within normal limits  APTT - Abnormal; Notable for the following components:   aPTT 23 (*)    All other components within normal limits   I-STAT CHEM 8, ED - Abnormal; Notable for the following components:   BUN 27 (*)    Creatinine, Ser 1.30 (*)    Glucose, Bld 170 (*)    TCO2 20 (*)    All other components within normal limits  SARS CORONAVIRUS 2 (HOSPITAL ORDER, PERFORMED IN [facility])  PROTIME-INR  COMPREHENSIVE METABOLIC PANEL  LIPID PANEL  TROPONIN I (HIGH SENSITIVITY)    DG Chest Port 1 View    (Results Pending)    Medications  0.9 %  sodium chloride infusion (has no administration in time range)  nitroGLYCERIN (NITROSTAT) SL tablet 0.4 mg ( Sublingual MAR Hold 02/12/19 1238)  lidocaine (PF) (XYLOCAINE) 1 % injection (2 mLs Subcutaneous Given 02/12/19 1250)  Radial Cocktail/Verapamil only (10 mLs Intra-arterial Given 02/12/19 1252)  midazolam (VERSED) injection (0.5 mg Intravenous Given 02/12/19 1249)  heparin injection (2,000 Units Intravenous Given 02/12/19 1323)  cangrelor (KENGREAL) bolus via infusion (2,124 mcg Intravenous Given 02/12/19 1301)  cangrelor (KENGREAL) 50,000 mcg in sodium chloride 0.9 % 250 mL (200 mcg/mL) infusion (4 mcg/kg/min  70.8 kg Intravenous New Bag/Given 02/12/19 1302)  nitroGLYCERIN 1 mg/10 mL (100 mcg/mL) - IR/CATH LAB (200 mcg Intracoronary Given 02/12/19 1312)  0.9 %  sodium chloride infusion (  Stopped 02/12/19 1329)  iohexol (OMNIPAQUE) 350 MG/ML injection (110 mLs Intra-arterial Given 02/12/19 1327)  Heparin (Porcine) in NaCl 1000-0.9 UT/500ML-% SOLN (500 mLs  Given 02/12/19 1327)  heparin injection 60 Units/kg (4,000 Units Intravenous Given 02/12/19 1225)  HYDROmorphone (DILAUDID) injection 0.5 mg (0.5 mg Intravenous Given 02/12/19 1233)  verapamil (ISOPTIN) 2.5 MG/ML injection (has no administration in time range)  heparin 1000 UNIT/ML injection (has no administration in time range)  lidocaine (PF) (XYLOCAINE) 1 % injection (has no administration in time range)  Heparin (Porcine) in NaCl 1000-0.9 UT/500ML-% SOLN (has no administration in time range)  nitroGLYCERIN 100 mcg/mL  intra-arterial injection (has no administration in time range)  midazolam (VERSED) 2 MG/2ML injection (has no administration in time range)  cangrelor (KENGREAL) 50 MG SOLR (has no administration in time range)     Procedures Critical Care Critical Care Documentation Critical care time provided by me (excluding procedures): 33 minutes  Condition necessitating critical care: ST elevation myocardial infarction  Components of critical care management: reviewing of prior records, laboratory and imaging interpretation, frequent re-examination and reassessment of vital signs, administration of IV heparin, nitroglycerin, IV Dilaudid, discussion with consulting services.  ED Course and Medical Decision Making  I have reviewed the triage vital signs and the nursing notes.  Pertinent labs & imaging results that were available during my care of the patient were reviewed by me and considered in my medical decision making (see below for details).  Clinical picture and out of hospital EKG consistent with ST elevation myocardial infarction involving the LAD.  EKG here improving, however still very much appropriate for intervention.  Brought to the catheterization lab for further care.  Barth Kirks. Sedonia Small, MD [place] [email or web address]  Final Clinical Impressions(s) / ED Diagnoses     ICD-10-CM   1. ST elevation myocardial infarction involving left anterior descending (LAD) coronary artery [facility])  I21.02     ED Discharge Orders    None         Maudie Flakes, MD 02/12/19 1332

## 2019-02-12 NOTE — Progress Notes (Signed)
   02/12/19 1400  Clinical Encounter Type  Visited With Health care provider  Visit Type Initial;Code  Referral From Other (Comment) (STEMI page)   Called ED to ck about pt, per ED bridge, had not arrived yet.  Myra Gianotti resident, [phone]

## 2019-02-12 NOTE — ED Triage Notes (Signed)
Pt arrives via EMS with reports of riding 33 miles on bike. Left sided CP began after stopping with radiation to left arm and jaw. 324 ASA given by EMS.

## 2019-02-12 NOTE — Progress Notes (Signed)
   02/12/19 1402  Clinical Encounter Type  Visited With Family;Health care provider  Visit Type Follow-up;Patient in surgery  Referral From Other (Comment) (ED)  Spiritual Encounters  Spiritual Needs Emotional  Stress Factors  Family Stress Factors Loss of control   ED asked to accompany pt's spouse to [facility] waiting room while pt in cath lab.  Met spouse, she talked about how active pt was, but knew his family had hx of heart problems even though pt didn't.  Assured her I would be in prayer for pt, as she asked.  Let CL know spouse in [place] waiting rm.  Myra Gianotti resident, [phone]

## 2019-02-13 ENCOUNTER — Encounter (HOSPITAL_COMMUNITY): Payer: Self-pay | Admitting: Cardiology

## 2019-02-13 ENCOUNTER — Inpatient Hospital Stay (HOSPITAL_COMMUNITY): Payer: BC Managed Care – PPO

## 2019-02-13 DIAGNOSIS — I2102 ST elevation (STEMI) myocardial infarction involving left anterior descending coronary artery: Secondary | ICD-10-CM

## 2019-02-13 LAB — POCT ACTIVATED CLOTTING TIME
Activated Clotting Time: 241 seconds
Activated Clotting Time: 246 seconds

## 2019-02-13 LAB — ECHOCARDIOGRAM COMPLETE
Height: 67 in
Weight: 2550.28 oz

## 2019-02-13 MED ORDER — LORATADINE 10 MG PO TABS: 10.0000 mg | ORAL_TABLET | Freq: Every day | ORAL | Status: DC

## 2019-02-13 MED ORDER — FLUTICASONE PROPIONATE 50 MCG/ACT NA SUSP
1.0000 | Freq: Two times a day (BID) | NASAL | Status: DC | PRN
  Filled 2019-02-13: qty 16

## 2019-02-13 NOTE — Progress Notes (Signed)
Progress Note  Patient Name: Tywon Niday Date of Encounter: 02/13/2019  Primary Cardiologist: No primary care provider on file. Dr. Ellyn Hack  Subjective   No chest pain.  He feels well.   Inpatient Medications    Scheduled Meds: . aspirin  81 mg Oral Daily  . atorvastatin  80 mg Oral q1800  . Chlorhexidine Gluconate Cloth  6 each Topical Q0600  . heparin  5,000 Units Subcutaneous Q8H  . metoprolol tartrate  12.5 mg Oral BID  . sodium chloride flush  3 mL Intravenous Q12H  . ticagrelor  90 mg Oral BID   Continuous Infusions: . sodium chloride     PRN Meds: sodium chloride, acetaminophen, morphine injection, ondansetron (ZOFRAN) IV, sodium chloride flush   Vital Signs    Vitals:   02/13/19 0900 02/13/19 0954 02/13/19 1000 02/13/19 1100  BP: 121/62     Pulse:  77    Resp: 19  20   Temp:    97.8 F (36.6 C)  TempSrc:    Oral  SpO2: 98%     Weight:      Height:        Intake/Output Summary (Last 24 hours) at 02/13/2019 1128 Last data filed at 02/13/2019 0600 Gross per 24 hour  Intake 1723.2 ml  Output 450 ml  Net 1273.2 ml   Last 3 Weights 02/13/2019 02/12/2019  Weight (lbs) 159 lb 6.3 oz 156 lb  Weight (kg) 72.3 kg 70.761 kg      Telemetry    NSR - Personally Reviewed  ECG    post PCI ECG improved. - Personally Reviewed  Physical Exam   GEN: No acute distress.   Neck: No JVD Cardiac: RRR, no murmurs, rubs, or gallops.  Respiratory: Clear to auscultation bilaterally. GI: Soft, nontender, non-distended  MS: No edema; No deformity.  2+ right radial pulse.  No hematoma Neuro:  Nonfocal  Psych: Normal affect   Labs    High Sensitivity Troponin:   Recent Labs  Lab 02/12/19 1231 02/12/19 1417 02/12/19 1608 02/12/19 1832 02/12/19 2039  TROPONINIHS 19* 517* 2,359* 7,269* 12,648*      Cardiac EnzymesNo results for input(s): TROPONINI in the last 168 hours. No results for input(s): TROPIPOC in the last 168 hours.   Chemistry Recent Labs  Lab 02/12/19  1231 02/12/19 1233 02/12/19 1417  NA 139 141  --   K 4.7 4.6  --   CL 106 108  --   CO2 19*  --   --   GLUCOSE 175* 170*  --   BUN 26* 27*  --   CREATININE 1.46* 1.30* 1.35*  CALCIUM 9.9  --   --   PROT 7.7  --   --   ALBUMIN 4.2  --   --   AST 26  --   --   ALT 22  --   --   ALKPHOS 51  --   --   BILITOT 0.7  --   --   GFRNONAA 51*  --  56*  GFRAA 59*  --  >60  ANIONGAP 14  --   --      Hematology Recent Labs  Lab 02/12/19 1231 02/12/19 1233 02/12/19 1417  WBC 14.1*  --  17.0*  RBC 4.28  --  3.95*  HGB 14.3 13.9 13.2  HCT 41.3 41.0 38.4*  MCV 96.5  --  97.2  MCH 33.4  --  33.4  MCHC 34.6  --  34.4  RDW 12.2  --  12.2  PLT 286  --  220    BNPNo results for input(s): BNP, PROBNP in the last 168 hours.   DDimer No results for input(s): DDIMER in the last 168 hours.   Radiology    Dg Chest Port 1 View  Result Date: 02/12/2019 CLINICAL DATA:  Acute ST elevation myocardial infarction (STEMI) involving left anterior descending (LAD) coronary artery without development of Q waves (HCC) EXAM: PORTABLE CHEST 1 VIEW COMPARISON:  None. FINDINGS: Normal heart size. Clear lung fields. No bony abnormality. IMPRESSION: No active disease. Electronically Signed   By: Elsie StainJohn T Curnes M.D.   On: 02/12/2019 14:37    Cardiac Studies   Cath films personally reviewed  Patient Profile     63 y.o. male s/p anterior MI  Assessment & Plan    1) CAD/Anterior MI: COntinue DAPT along with high dose statin.  Explained rationale of high dose statin.  He is in agreement to take the higher dose.   2) Plan to transfer to tele. Possible discharge in AM.   Renal function stable.   For questions or updates, please contact [facility] Please consult [email or web address] for contact info under        Signed, Lance MussJayadeep Mabel Roll, MD  02/13/2019, 11:28 AM

## 2019-02-13 NOTE — Progress Notes (Signed)
  Echocardiogram 2D Echocardiogram has been performed.  Samuel [name] 02/13/2019, 4:44 PM

## 2019-02-14 ENCOUNTER — Encounter (HOSPITAL_COMMUNITY): Payer: Self-pay | Admitting: *Deleted

## 2019-02-14 DIAGNOSIS — Z8249 Family history of ischemic heart disease and other diseases of the circulatory system: Secondary | ICD-10-CM

## 2019-02-14 DIAGNOSIS — J302 Other seasonal allergic rhinitis: Secondary | ICD-10-CM

## 2019-02-14 HISTORY — DX: Other seasonal allergic rhinitis: J30.2

## 2019-02-14 LAB — BASIC METABOLIC PANEL
Anion gap: 9 (ref 5–15)
BUN: 15 mg/dL (ref 8–23)
CO2: 23 mmol/L (ref 22–32)
Calcium: 9.1 mg/dL (ref 8.9–10.3)
Chloride: 107 mmol/L (ref 98–111)
Creatinine, Ser: 0.96 mg/dL (ref 0.61–1.24)
GFR calc Af Amer: >60 mL/min (ref >60)
GFR calc non Af Amer: >60 mL/min (ref >60)
Glucose, Bld: 111 mg/dL — ABNORMAL HIGH (ref 70–99)
Potassium: 3.9 mmol/L (ref 3.5–5.1)
Sodium: 139 mmol/L (ref 135–145)

## 2019-02-14 MED ORDER — ATORVASTATIN CALCIUM 80 MG PO TABS: 80.0000 mg | ORAL_TABLET | Freq: Every day | ORAL | 6 refills | Status: DC

## 2019-02-14 MED ORDER — METOPROLOL TARTRATE 25 MG PO TABS: 12.5000 mg | ORAL_TABLET | Freq: Two times a day (BID) | ORAL | 6 refills | Status: DC

## 2019-02-14 MED ORDER — TICAGRELOR 90 MG PO TABS: 90.0000 mg | ORAL_TABLET | Freq: Two times a day (BID) | ORAL | 11 refills | Status: DC

## 2019-02-14 MED ORDER — LISINOPRIL 2.5 MG PO TABS: 2.5000 mg | ORAL_TABLET | Freq: Every day | ORAL | 11 refills | Status: DC

## 2019-02-14 MED ORDER — NITROGLYCERIN 0.4 MG SL SUBL: 0.4000 mg | SUBLINGUAL_TABLET | SUBLINGUAL | 12 refills | Status: DC | PRN

## 2019-02-14 MED ORDER — ASPIRIN 81 MG PO CHEW: 81.0000 mg | CHEWABLE_TABLET | Freq: Every day | ORAL | Status: AC

## 2019-02-14 MED FILL — ATORVASTATIN CALCIUM 80 MG: 80 | 30 days supply | Qty: 30 | Fill #0

## 2019-02-14 MED FILL — NITROGLYCERIN 0.4 MG TAB SL: 0.4 | 8 days supply | Qty: 25 | Fill #0

## 2019-02-14 MED FILL — BRILINTA 90 MG TABLET: 90 | 30 days supply | Qty: 60 | Fill #0

## 2019-02-14 MED FILL — METOPROLOL TARTRATE 25 MG T: 25 | 30 days supply | Qty: 30 | Fill #0

## 2019-02-14 MED FILL — LISINOPRIL 2.5 MG TABLET: 2.5 | 30 days supply | Qty: 30 | Fill #0

## 2019-02-14 NOTE — Progress Notes (Addendum)
Progress Note  Patient Name: Samuel [name] Date of Encounter: 02/14/2019  Primary Cardiologist: New (will be followed at [facility])  Subjective   No recurrent chest pain.  He has intermittent shortness of breath since last night.  Same side effect of Brilinta.  Inpatient Medications    Scheduled Meds: . aspirin  81 mg Oral Daily  . atorvastatin  80 mg Oral q1800  . heparin  5,000 Units Subcutaneous Q8H  . metoprolol tartrate  12.5 mg Oral BID  . sodium chloride flush  3 mL Intravenous Q12H  . ticagrelor  90 mg Oral BID   Continuous Infusions: . sodium chloride     PRN Meds: sodium chloride, acetaminophen, fluticasone, morphine injection, ondansetron (ZOFRAN) IV, sodium chloride flush   Vital Signs    Vitals:   02/13/19 2340 02/14/19 0531 02/14/19 0742 02/14/19 0854  BP: 123/63 123/72 (!) 118/57 134/69  Pulse: 70 65 72 72  Resp: 18 18 17    Temp: 98.3 F (36.8 C) 98 F (36.7 C) 97.9 F (36.6 C) 98.1 F (36.7 C)  TempSrc: Oral  Oral Oral  SpO2: 97% 99% 98% 100%  Weight:  71.3 kg    Height:        Intake/Output Summary (Last 24 hours) at 02/14/2019 0911 Last data filed at 02/14/2019 0743 Gross per 24 hour  Intake 600 ml  Output 1575 ml  Net -975 ml   Last 3 Weights 02/14/2019 02/13/2019 02/12/2019  Weight (lbs) 157 lb 3.2 oz 159 lb 6.3 oz 156 lb  Weight (kg) 71.305 kg 72.3 kg 70.761 kg      Telemetry    Sinus rhythm at rate of 60 to 70s- Personally Reviewed  ECG    No new tracing  Physical Exam   GEN: No acute distress.   Neck: No JVD Cardiac: RRR, no murmurs, rubs, or gallops.  Right radial cath site without hematoma Respiratory: Clear to auscultation bilaterally. GI: Soft, nontender, non-distended  MS: No edema; No deformity. Neuro:  Nonfocal  Psych: Normal affect   Labs    High Sensitivity Troponin:   Recent Labs  Lab 02/12/19 1231 02/12/19 1417 02/12/19 1608 02/12/19 1832 02/12/19 2039  TROPONINIHS 19* 517* 2,359* 7,269* 12,648*      Cardiac  EnzymesNo results for input(s): TROPONINI in the last 168 hours. No results for input(s): TROPIPOC in the last 168 hours.   Chemistry Recent Labs  Lab 02/12/19 1231 02/12/19 1233 02/12/19 1417  NA 139 141  --   K 4.7 4.6  --   CL 106 108  --   CO2 19*  --   --   GLUCOSE 175* 170*  --   BUN 26* 27*  --   CREATININE 1.46* 1.30* 1.35*  CALCIUM 9.9  --   --   PROT 7.7  --   --   ALBUMIN 4.2  --   --   AST 26  --   --   ALT 22  --   --   ALKPHOS 51  --   --   BILITOT 0.7  --   --   GFRNONAA 51*  --  56*  GFRAA 59*  --  >60  ANIONGAP 14  --   --      Hematology Recent Labs  Lab 02/12/19 1231 02/12/19 1233 02/12/19 1417  WBC 14.1*  --  17.0*  RBC 4.28  --  3.95*  HGB 14.3 13.9 13.2  HCT 41.3 41.0 38.4*  MCV 96.5  --  97.2  MCH 33.4  --  33.4  MCHC 34.6  --  34.4  RDW 12.2  --  12.2  PLT 286  --  220    Radiology    Dg Chest Port 1 View  Result Date: 02/12/2019 CLINICAL DATA:  Acute ST elevation myocardial infarction (STEMI) involving left anterior descending (LAD) coronary artery without development of Q waves (HCC) EXAM: PORTABLE CHEST 1 VIEW COMPARISON:  None. FINDINGS: Normal heart size. Clear lung fields. No bony abnormality. IMPRESSION: No active disease. Electronically Signed   By: Elsie StainJohn T Curnes M.D.   On: 02/12/2019 14:37    Cardiac Studies   Coronary/Graft Acute MI Revascularization  LEFT HEART CATH AND CORONARY ANGIOGRAPHY  Conclusion    Culprit lesion: Mid LAD lesion is 100% stenosed.  A drug-eluting stent was successfully placed using a STENT RESOLUTE ONYX [id number].--Postdilated to 3.0 mm  Post intervention, there is a 0% residual stenosis.  ------------------------------------  1st Diag lesion is 50% stenosed.  Prox RCA lesion is 30% stenosed.  Mid Cx lesion is 20% stenosed with 40% stenosed side branch in LPAV.  LPAV lesion is 40% stenosed with 40% stenosed side branch in 1st LPL.  ------------------------------------  There is mild  left ventricular systolic dysfunction. The left ventricular ejection fraction is 35-45% by visual estimate.  LV end diastolic pressure is mildly elevated.   SUMMARY  Severe single-vessel disease of 100% occlusion of the mid LAD just after first diagonal branch.  Successful DES PCI with Resolute Onyx 2.75 mm x 18 mm (postdilated to 3.0 mm)  Mildly reduced LVEF with anterior and inferior apical hypokinesis.  Mildly elevated LVEDP.  RECOMMENDATIONS  Admit to CCU for ongoing care TR band removal.  Anticipate possible fast-track discharge.  Consider checking 2D echo prior to discharge  High intensity high-dose statin started  Low-dose beta-blocker started  Brilinta given in Cath Lab, [facility]    Echo 02/13/19 IMPRESSIONS    1. The left ventricle has mildly reduced systolic function, with an ejection fraction of 45-50%. The cavity size was normal. Left ventricular diastolic Doppler parameters are consistent with pseudonormalization. Akinesis of the true apex and the apical  septal, apical anterior, and apical inferior wall segments.  2. The right ventricle has normal systolic function. The cavity was normal. There is no increase in right ventricular wall thickness.  3. The aortic valve is tricuspid. Severe calcifcation of the aortic valve. Aortic valve regurgitation is trivial by color flow Doppler. Despite significant valvular calcification, there does not appear to be significant aortic stenosis.  4. The aorta is normal in size and structure.  5. No evidence of mitral valve stenosis. No significant mitral regurgitation.  6. The IVC was normal in size. No complete TR doppler jet so unable to estimate PA systolic pressure.   Patient Profile     Samuel [name] is a 63 y.o. male with history of relatively controlled hyperlipidemia for which he takes a low-dose of a statin.  [facility]  Assessment & Plan    1.  Anterior STEMI -Cath showed 100% stenosis mid LAD lesion status post PCI with DES.  No recurrent chest pain.  No complication.  Ambulating well.  Echocardiogram showed low normal LV function to 45 to 50%. -Continue aspirin, Brilinta, statin and beta-blocker  2.  Ischemic cardiomyopathy -Echo showed LV function of 45 to 50% with akinesis of the true apex and the apical septal, apical  anterior, and apical inferior wall segments. - By cath mildly reduced LVEF with anterior and inferior apical hypokinesis.  Mildly elevated LVEDP. -Patient has intermittent dyspnea since yesterday with and without laying down (worse at night waking up from sleep). He is not volume overloaded by exam.  Likely side effect of Brilinta. -He just got his morning dose of Brilinta.  Will reevaluate symptoms.  Will give PRN Lasix at discharge. -We will consolidate Lopressor to long-acting.  Will add ACE/ARB pending BMET.   3.  AKI -Serum creatinine 1.46 post-cath 2 days ago. -We will recheck BMP today   4. HLD - 02/12/2019: Cholesterol 162; HDL 55; LDL Cholesterol 83; Triglycerides 120; VLDL 24  -Lipitor increased to 80 mg daily -Lipid panel and LFTs in 6 weeks  Ambulate.  Reevaluate his dyspnea.  Discharge later today. For questions or updates, please contact [facility] Please consult [email or web address] for contact info under        Signed, Manson PasseyBhavinkumar Bhagat, PA  02/14/2019, 9:11 AM     I have examined the patient and reviewed assessment and plan and discussed with patient.  Agree with above as stated.  Plan for discharge today.  EF improved by echo.  COntinue DAPT with aggressive secondary prevention.    BMet pending.   Lance MussJayadeep Alica Shellhammer

## 2019-02-14 NOTE — Progress Notes (Signed)
Patient alert and oriented, denies pain, v/s stable, iv and tele d/c. D/c instruction explain and given to the patient, all questions answered patient verbalized understanding. Patient d/c home per order.

## 2019-02-14 NOTE — Plan of Care (Signed)
  Problem: Activity: Goal: Risk for activity intolerance will decrease Outcome: Progressing   Problem: Safety: Goal: Ability to remain free from injury will improve Outcome: Progressing   

## 2019-02-14 NOTE — Progress Notes (Signed)
Patient c/o shortness of breath waking him up while sleeping, patient said the shortness of breath started yesterday with his head of the bed up or not. Will notified MD and continue to monitor the patient.

## 2019-02-14 NOTE — Progress Notes (Signed)
Patient ambulate in hallway x3 denies chest pain, no shortness of breath with ambulation, no dizziness will continue to monitor the patient.

## 2019-02-14 NOTE — Discharge Summary (Addendum)
Discharge Summary    Patient ID: Marita KansasDon Vigna MRN: [id number]; DOB: 10-09-1955  Admit date: 02/12/2019 Discharge date: 02/14/2019  Primary Care Provider: No primary care provider on file.  Primary Cardiologist: New ([facility]  Discharge Diagnoses    Principal Problem:   Acute ST elevation myocardial infarction (STEMI) of anterior wall (HCC) Active Problems:   Hyperlipidemia with target LDL less than 70   Acute ST elevation myocardial infarction (STEMI) involving left anterior descending (LAD) coronary artery without development of Q waves (HCC)   AKI  Allergies Allergies  Allergen Reactions   Sulfa Antibiotics Other (See Comments)    Pt unable to recall reaction    Diagnostic Studies/Procedures    Coronary/Graft Acute MI Revascularization  LEFT HEART CATH AND CORONARY ANGIOGRAPHY  Conclusion    Culprit lesion: Mid LAD lesion is 100% stenosed.  A drug-eluting stent was successfully placed using a STENT RESOLUTE ONYX [id number].--Postdilated to 3.0 mm  Post intervention, there is a 0% residual stenosis.  ------------------------------------  1st Diag lesion is 50% stenosed.  Prox RCA lesion is 30% stenosed.  Mid Cx lesion is 20% stenosed with 40% stenosed side branch in LPAV.  LPAV lesion is 40% stenosed with 40% stenosed side branch in 1st LPL.  ------------------------------------  There is mild left ventricular systolic dysfunction. The left ventricular ejection fraction is 35-45% by visual estimate.  LV end diastolic pressure is mildly elevated.  SUMMARY  Severe single-vessel disease of 100% occlusion of the mid LAD just after first diagonal branch.  Successful DES PCI with Resolute Onyx 2.75 mm x 18 mm (postdilated to 3.0 mm)  Mildly reduced LVEF with anterior and inferior apical hypokinesis. Mildly elevated LVEDP.  RECOMMENDATIONS  Admit to CCU for ongoing care TR band removal. Anticipate possible fast-track  discharge.  Consider checking 2D echo prior to discharge  High intensity high-dose statin started  Low-dose beta-blocker started  Brilinta given in Cath Lab, [facility]    Echo 02/13/19 IMPRESSIONS   1. The left ventricle has mildly reduced systolic function, with an ejection fraction of 45-50%. The cavity size was normal. Left ventricular diastolic Doppler parameters are consistent with pseudonormalization. Akinesis of the true apex and the apical  septal, apical anterior, and apical inferior wall segments. 2. The right ventricle has normal systolic function. The cavity was normal. There is no increase in right ventricular wall thickness. 3. The aortic valve is tricuspid. Severe calcifcation of the aortic valve. Aortic valve regurgitation is trivial by color flow Doppler. Despite significant valvular calcification, there does not appear to be significant aortic stenosis. 4. The aorta is normal in size and structure. 5. No evidence of mitral valve stenosis. No significant mitral regurgitation. 6. The IVC was normal in size. No complete TR doppler jet so unable to estimate PA systolic pressure.    History of Present Illness     Ascencion DikeDon Hillis a 63 y.o.malewith history of relatively controlled hyperlipidemia for which he takes a low-dose of a statin. [facility].  Hospital Course     Consultants: None  1. Anterior STEMI -Cath showed 100% stenosis mid LAD lesion status post PCI with DES.  No recurrent chest pain.  No complications.  Ambulating well.  Echocardiogram showed low normal LV function to 45 to 50%. CRP II as outpatient.  -Continue aspirin, Brilinta, statin and beta-blocker  2.  Ischemic cardiomyopathy -Echo showed LV function of 45 to 50% with  akinesis of the true apex and the apical septal, apical anterior, and apical inferior wall segments. By cath mildly reduced LVEF with anterior and inferior  apical hypokinesis. Mildly elevated LVEDP. Patient has intermittent dyspnea  with and without laying down (worse at night waking up from sleep). He is not volume overloaded by exam.  Likely side effect of Brilinta. Advised to take Brillinta with caffeine. Low sodium diet. -Continue Lopressor. Added low dose lisinopril at discharge. BMET as outpatient.   3.  AKI -Serum creatinine 1.46 post-cath which normalized at discharge.  -Added lisinopril.   4. HLD - 02/12/2019: Cholesterol 162; HDL 55; LDL Cholesterol 83; Triglycerides 120; VLDL 24  -Lipitor increased to 80 mg daily -Lipid panel and LFTs in 6 weeks  Discharge Vitals Blood pressure 125/82, pulse (!) 58, temperature 98.6 F (37 C), temperature source Oral, resp. rate 18, height 5\' 7"  (1.702 m), weight 71.3 kg, SpO2 100 %.  Filed Weights   02/12/19 1227 02/13/19 0600 02/14/19 0531  Weight: 70.8 kg 72.3 kg 71.3 kg    Labs & Radiologic Studies    CBC Recent Labs    02/12/19 1231 02/12/19 1233 02/12/19 1417  WBC 14.1*  --  17.0*  NEUTROABS 11.3*  --   --   HGB 14.3 13.9 13.2  HCT 41.3 41.0 38.4*  MCV 96.5  --  97.2  PLT 286  --  921   Basic Metabolic Panel Recent Labs    02/12/19 1231 02/12/19 1233 02/12/19 1417 02/14/19 1220  NA 139 141  --  139  K 4.7 4.6  --  3.9  CL 106 108  --  107  CO2 19*  --   --  23  GLUCOSE 175* 170*  --  111*  BUN 26* 27*  --  15  CREATININE 1.46* 1.30* 1.35* 0.96  CALCIUM 9.9  --   --  9.1   Liver Function Tests Recent Labs    02/12/19 1231  AST 26  ALT 22  ALKPHOS 51  BILITOT 0.7  PROT 7.7  ALBUMIN 4.2   Fasting Lipid Panel Recent Labs    02/12/19 1231  CHOL 162  HDL 55  LDLCALC 83  TRIG 120  CHOLHDL 2.9  _____________  Dg Chest Port 1 View  Result Date: 02/12/2019 CLINICAL DATA:  Acute ST elevation myocardial infarction (STEMI) involving left anterior descending (LAD) coronary artery without development of Q waves (HCC) EXAM: PORTABLE CHEST 1 VIEW COMPARISON:   None. FINDINGS: Normal heart size. Clear lung fields. No bony abnormality. IMPRESSION: No active disease. Electronically Signed   By: Staci Righter M.D.   On: 02/12/2019 14:37   Disposition   Pt is being discharged home today in good condition.  Follow-up Plans & Appointments    Follow-up Information    Richardo Priest, MD. Go on 02/20/2019.   Specialty: Cardiology Why: @1 :55pm for hospital follow up. call if need to reschedule  Contact information: [address] [place] [address] [phone]          Discharge Instructions    Diet - low sodium heart healthy   Complete by: As directed    Discharge instructions   Complete by: As directed    No driving for 2 weeks. No lifting over 10 lbs for 4 weeks. No sexual activity for 4 weeks. You may not return to work until cleared by your cardiologist. Keep procedure site clean & dry. If you notice increased pain, swelling, bleeding or pus,  call/return!  You may shower, but no soaking baths/hot tubs/pools for 1 week.   Increase activity slowly   Complete by: As directed       Discharge Medications   Allergies as of 02/14/2019      Reactions   Sulfa Antibiotics Other (See Comments)   Pt unable to recall reaction      Medication List    TAKE these medications   ALLEGRA PO Take 1 tablet by mouth daily.   aspirin EC 81 MG tablet Take 324 mg by mouth once.   atorvastatin 80 MG tablet Commonly known as: LIPITOR Take 1 tablet (80 mg total) by mouth daily. What changed:   medication strength  how much to take   [employer] Nasal Decongestant PE 10 MG Tabs tablet Generic drug: phenylephrine Take 10 mg by mouth every 6 (six) hours as needed (allergies).   fluticasone 50 MCG/ACT nasal spray Commonly known as: FLONASE Place 1 spray into both nostrils daily.   ibuprofen 200 MG tablet Commonly known as: ADVIL Take 200 mg by mouth every 6 (six) hours as needed for mild pain.   lisinopril 2.5 MG tablet Commonly  known as: Zestril Take 1 tablet (2.5 mg total) by mouth daily.   metoprolol tartrate 25 MG tablet Commonly known as: LOPRESSOR Take 0.5 tablets (12.5 mg total) by mouth 2 (two) times daily.   nitroGLYCERIN 0.4 MG SL tablet Commonly known as: Nitrostat Place 1 tablet (0.4 mg total) under the tongue every 5 (five) minutes as needed.   ticagrelor 90 MG Tabs tablet Commonly known as: BRILINTA Take 1 tablet (90 mg total) by mouth 2 (two) times daily.        Acute coronary syndrome (MI, NSTEMI, STEMI, etc) this admission?: Yes.     AHA/ACC Clinical Performance & Quality Measures: 1. Aspirin prescribed? - Yes 2. ADP Receptor Inhibitor (Plavix/Clopidogrel, Brilinta/Ticagrelor or Effient/Prasugrel) prescribed (includes medically managed patients)? - Yes 3. Beta Blocker prescribed? - Yes 4. High Intensity Statin (Lipitor 40-80mg  or Crestor 20-40mg ) prescribed? - Yes 5. EF assessed during THIS hospitalization? - Yes 6. For EF <40%, was ACEI/ARB prescribed? - Yes 7. For EF <40%, Aldosterone Antagonist (Spironolactone or Eplerenone) prescribed? - Not Applicable (EF >/= 40%) 8. Cardiac Rehab Phase II ordered (Included Medically managed Patients)? - Yes     Outstanding Labs/Studies   BMET at follow up Lipid panel and LFTS in 6-8 weeks  Duration of Discharge Encounter   Greater than 30 minutes including physician time.  SignedSharrell Ku, Bhavinkumar Tarpey VillageBhagat, PA 02/14/2019, 2:53 PM  I have examined the patient and reviewed assessment and plan and discussed with patient.  Agree with above as stated.  Please see mty note from earlier today.  Wrist stable.  EF improved to 45-50%.  Mild SHOB , liekly related to Brilinta.  SHould resolve quickly. Continue aggressive secondary prevention with lipid lowering therapy.  He is already exercising regularly.   Renal function improved.  Plan discharge later today.  Lance MussJayadeep Todd Argabright

## 2019-02-19 NOTE — Progress Notes (Signed)
Cardiology Office Note:    Date:  02/20/2019   ID:  Samuel [name], DOB 1956/06/11, MRN [id number]  PCP:  Lise AuerKhan, Jaber A, MD  Cardiologist:  Norman HerrlichBrian Jahmal Dunavant, MD    Referring MD: No ref. provider found    ASSESSMENT:    1. Hyperlipidemia with target LDL less than 70   2. CAD in native artery   3. LV dysfunction    PLAN:    In order of problems listed above:  1. Continue high intensity statin check LP(a) and if his LDL remains greater than 55 would benefit from coincident PCSK9 2. Stable continue dual antiplatelet beta-blocker statin recheck echocardiogram regarding aneurysm thrombus refer to cardiac rehabilitation 3. Recheck echocardiogram   Next appointment: 6 weeks   Medication Adjustments/Labs and Tests Ordered: Current medicines are reviewed at length with the patient today.  Concerns regarding medicines are outlined above.  No orders of the defined types were placed in this encounter.  No orders of the defined types were placed in this encounter.   Chief Complaint  Patient presents with  . Follow-up    after STEMI  . Coronary Artery Disease  . Hypertension  . Hyperlipidemia    History of Present Illness:    Samuel [name] is a 63 y.o. male with a hx of anterior ST elevation MI in 02/12/2019 with PCI and drug-eluting stent to left anterior descending coronary artery ejection fraction 45 to 50% with akinesia of the apex and apical septum, inferior and anterior's segments During his stay he had acute kidney injury with a creatinine elevated to 1.46 and had shortness of breath attributed to Brilinta but he was given a prescription for as needed furosemide at discharge.   Coronary/Graft Acute MI Revascularization  LEFT HEART CATH AND CORONARY ANGIOGRAPHY  Conclusion    Culprit lesion: Mid LAD lesion is 100% stenosed.  A drug-eluting stent was successfully placed using a STENT RESOLUTE ONYX [id number].--Postdilated to 3.0 mm  Post intervention, there is a 0% residual  stenosis.  ------------------------------------  1st Diag lesion is 50% stenosed.  Prox RCA lesion is 30% stenosed.  Mid Cx lesion is 20% stenosed with 40% stenosed side branch in LPAV.  LPAV lesion is 40% stenosed with 40% stenosed side branch in 1st LPL.  ------------------------------------  There is mild left ventricular systolic dysfunction. The left ventricular ejection fraction is 35-45% by visual estimate.  LV end diastolic pressure is mildly elevated.  SUMMARY  Severe single-vessel disease of 100% occlusion of the mid LAD just after first diagonal branch.  Successful DES PCI with Resolute Onyx 2.75 mm x 18 mm (postdilated to 3.0 mm)  Mildly reduced LVEF with anterior and inferior apical hypokinesis. Mildly elevated LVEDP.     Echo 02/13/19 IMPRESSIONS   1. The left ventricle has mildly reduced systolic function, with an ejection fraction of 45-50%. The cavity size was normal. Left ventricular diastolic Doppler parameters are consistent with pseudonormalization. Akinesis of the true apex and the apical  septal, apical anterior, and apical inferior wall segments. 2. The right ventricle has normal systolic function. The cavity was normal. There is no increase in right ventricular wall thickness. 3. The aortic valve is tricuspid. Severe calcifcation of the aortic valve. Aortic valve regurgitation is trivial by color flow Doppler. Despite significant valvular calcification, there does not appear to be significant aortic stenosis. 4. The aorta is normal in size and structure. 5. No evidence of mitral valve stenosis. No significant mitral regurgitation. 6. The IVC was normal in size. No  complete TR doppler jet so unable to estimate PA systolic pressure.  Compliance with diet, lifestyle and medications: Yes  Being discharged although shortness of breath is been no recurrence he is done walking some garden work and has had no exercise intolerance dyspnea cough edema  shortness of breath or chest pain.  He will be referred to cardiac rehabilitation.  With his akinetic apex we will recheck an echocardiogram regarding thrombus in 2 weeks and with his market family history of premature vascular disease check LP(a) with his next lipid profile and a goal of an LDL less than 50. Past Medical History:  Diagnosis Date  . Acute MI, anterior wall ([place]) 02/12/2019  . CAD (coronary artery disease), native coronary artery    a. cath 02/2019 S/p DES to mLAD for 100% stenosis   . Hyperlipidemia LDL goal <70   . Ischemic cardiomyopathy     Past Surgical History:  Procedure Laterality Date  . CARDIAC CATHETERIZATION    . CORONARY/GRAFT ACUTE MI REVASCULARIZATION N/A 02/12/2019   Procedure: Coronary/Graft Acute MI Revascularization;  Surgeon: Leonie Man, MD;  Location: [facility];  Service: Cardiovascular;  Laterality: N/A;  . HAND SURGERY     right  . LEFT HEART CATH AND CORONARY ANGIOGRAPHY N/A 02/12/2019   Procedure: LEFT HEART CATH AND CORONARY ANGIOGRAPHY;  Surgeon: Leonie Man, MD;  Location: [facility];  Service: Cardiovascular;  Laterality: N/A;    Current Medications: Current Meds  Medication Sig  . aspirin 81 MG chewable tablet Chew 1 tablet (81 mg total) by mouth daily.  Marland Kitchen atorvastatin (LIPITOR) 80 MG tablet Take 1 tablet (80 mg total) by mouth daily.  Marland Kitchen Fexofenadine HCl (ALLEGRA PO) Take 1 tablet by mouth daily.   . fluticasone (FLONASE) 50 MCG/ACT nasal spray Place 1 spray into both nostrils daily.  Marland Kitchen ibuprofen (ADVIL) 200 MG tablet Take 200 mg by mouth every 6 (six) hours as needed for mild pain.  Marland Kitchen lisinopril (ZESTRIL) 2.5 MG tablet Take 1 tablet (2.5 mg total) by mouth daily.  . metoprolol tartrate (LOPRESSOR) 25 MG tablet Take 0.5 tablets (12.5 mg total) by mouth 2 (two) times daily.  . nitroGLYCERIN (NITROSTAT) 0.4 MG SL tablet Place 1 tablet (0.4 mg total) under the tongue every 5 (five) minutes as needed.  . ticagrelor  (BRILINTA) 90 MG TABS tablet Take 1 tablet (90 mg total) by mouth 2 (two) times daily.     Allergies:   Sulfa antibiotics   Social History   Socioeconomic History  . Marital status: Unknown    Spouse name: Not on file  . Number of children: Not on file  . Years of education: Not on file  . Highest education level: Not on file  Occupational History  . Not on file  Social Needs  . Financial resource strain: Not on file  . Food insecurity    Worry: Not on file    Inability: Not on file  . Transportation needs    Medical: Not on file    Non-medical: Not on file  Tobacco Use  . Smoking status: Never Smoker  . Smokeless tobacco: Never Used  Substance and Sexual Activity  . Alcohol use: No    Alcohol/week: 0.0 standard drinks  . Drug use: No  . Sexual activity: Not on file  Lifestyle  . Physical activity    Days per week: Not on file    Minutes per session: Not on file  . Stress: Not on file  Relationships  . Social Musicianconnections    Talks on phone: Not on file    Gets together: Not on file    Attends religious service: Not on file    Active member of club or organization: Not on file    Attends meetings of clubs or organizations: Not on file    Relationship status: Not on file  Other Topics Concern  . Not on file  Social History Narrative   Married   Patient is very active, he walks usually 2 to 4 miles a day, and bikes 20 to 30 miles 2 times a week.   Non-smoker     Family History: The patient's family history includes CAD in his paternal uncle; CAD (age of onset: 6350) in his father; Diabetes in his sister; Heart Problems in his brother; Heart attack in his mother; Hypertension in his mother and sister; Parkinson's disease in his mother. ROS:   Please see the history of present illness.    All other systems reviewed and are negative.  EKGs/Labs/Other Studies Reviewed:    The following studies were reviewed today:  EKG:  EKG ordered today and personally reviewed.   The ekg ordered today demonstrates sinus rhythm 67 bpm he has ischemic T wave inversion in 3 and F and subtle ST elevation residual in those leads raising concern for aneurysm  Recent Labs: 02/12/2019: ALT 22; Hemoglobin 13.2; Platelets 220 02/14/2019: BUN 15; Creatinine, Ser 0.96; Potassium 3.9; Sodium 139  Recent Lipid Panel    Component Value Date/Time   CHOL 162 02/12/2019 1231   TRIG 120 02/12/2019 1231   HDL 55 02/12/2019 1231   CHOLHDL 2.9 02/12/2019 1231   VLDL 24 02/12/2019 1231   LDLCALC 83 02/12/2019 1231    Physical Exam:    VS:  BP 120/64 (BP Location: Right Arm, Patient Position: Sitting, Cuff Size: Normal)   Pulse 67   Ht 5\' 7"  (1.702 m)   Wt 154 lb (69.9 kg)   SpO2 98%   BMI 24.12 kg/m     Wt Readings from Last 3 Encounters:  02/20/19 154 lb (69.9 kg)  02/14/19 157 lb 3.2 oz (71.3 kg)     GEN:  Well nourished, well developed in no acute distress HEENT: Normal NECK: No JVD; No carotid bruits LYMPHATICS: No lymphadenopathy CARDIAC: RRR, no murmurs, rubs, gallops RESPIRATORY:  Clear to auscultation without rales, wheezing or rhonchi  ABDOMEN: Soft, non-tender, non-distended MUSCULOSKELETAL:  No edema; No deformity  SKIN: Warm and dry NEUROLOGIC:  Alert and oriented x 3 PSYCHIATRIC:  Normal affect    Signed, Norman HerrlichBrian Verlin Duke, MD  02/20/2019 2:29 PM    [facility]

## 2019-02-20 ENCOUNTER — Other Ambulatory Visit: Payer: Self-pay

## 2019-02-20 ENCOUNTER — Ambulatory Visit (INDEPENDENT_AMBULATORY_CARE_PROVIDER_SITE_OTHER): Payer: BC Managed Care – PPO | Admitting: Cardiology

## 2019-02-20 ENCOUNTER — Encounter: Payer: Self-pay | Admitting: Cardiology

## 2019-02-20 VITALS — BP 120/64 | HR 67 | Ht 67.0 in | Wt 154.0 lb

## 2019-02-20 DIAGNOSIS — E785 Hyperlipidemia, unspecified: Secondary | ICD-10-CM

## 2019-02-20 DIAGNOSIS — I519 Heart disease, unspecified: Secondary | ICD-10-CM

## 2019-02-20 DIAGNOSIS — I2510 Atherosclerotic heart disease of native coronary artery without angina pectoris: Secondary | ICD-10-CM

## 2019-02-20 HISTORY — DX: Atherosclerotic heart disease of native coronary artery without angina pectoris: I25.10

## 2019-02-20 HISTORY — DX: Heart disease, unspecified: I51.9

## 2019-02-20 NOTE — Patient Instructions (Signed)
Medication Instructions:  Your physician recommends that you continue on your current medications as directed. Please refer to the Current Medication list given to you today.  If you need a refill on your cardiac medications before your next appointment, please call your pharmacy.   Lab work: You will have lab work in 2 weeks.  CMP, Lipids, and Lipoprotein A  If you have labs (blood work) drawn today and your tests are completely normal, you will receive your results only by: Marland Kitchen. MyChart Message (if you have MyChart) OR . A paper copy in the mail If you have any lab test that is abnormal or we need to change your treatment, we will call you to review the results.  Testing/Procedures: Your physician has requested that you have an echocardiogram. Echocardiography is a painless test that uses sound waves to create images of your heart. It provides your doctor with information about the size and shape of your heart and how well your heart's chambers and valves are working. This procedure takes approximately one hour. There are no restrictions for this procedure.    Follow-Up: At [facility], you and your health needs are our priority.  As part of our continuing mission to provide you with exceptional heart care, we have created designated Provider Care Teams.  These Care Teams include your primary Cardiologist (physician) and Advanced Practice Providers (APPs -  Physician Assistants and Nurse Practitioners) who all work together to provide you with the care you need, when you need it. You will need a follow up appointment in 6 weeks.  Please call our office 2 months in advance to schedule this appointment.     Echocardiogram An echocardiogram is a procedure that uses painless sound waves (ultrasound) to produce an image of the heart. Images from an echocardiogram can provide important information about:  Signs of coronary artery disease (CAD).  Aneurysm detection. An aneurysm is a weak or  damaged part of an artery wall that bulges out from the normal force of blood pumping through the body.  Heart size and shape. Changes in the size or shape of the heart can be associated with certain conditions, including heart failure, aneurysm, and CAD.  Heart muscle function.  Heart valve function.  Signs of a past heart attack.  Fluid buildup around the heart.  Thickening of the heart muscle.  A tumor or infectious growth around the heart valves. Tell a health care provider about:  Any allergies you have.  All medicines you are taking, including vitamins, herbs, eye drops, creams, and over-the-counter medicines.  Any blood disorders you have.  Any surgeries you have had.  Any medical conditions you have.  Whether you are pregnant or may be pregnant. What are the risks? Generally, this is a safe procedure. However, problems may occur, including:  Allergic reaction to dye (contrast) that may be used during the procedure. What happens before the procedure? No specific preparation is needed. You may eat and drink normally. What happens during the procedure?   An IV tube may be inserted into one of your veins.  You may receive contrast through this tube. A contrast is an injection that improves the quality of the pictures from your heart.  A gel will be applied to your chest.  A wand-like tool (transducer) will be moved over your chest. The gel will help to transmit the sound waves from the transducer.  The sound waves will harmlessly bounce off of your heart to allow the heart images to  be captured in real-time motion. The images will be recorded on a computer. The procedure may vary among health care providers and hospitals. What happens after the procedure?  You may return to your normal, everyday life, including diet, activities, and medicines, unless your health care provider tells you not to do that. Summary  An echocardiogram is a procedure that uses painless  sound waves (ultrasound) to produce an image of the heart.  Images from an echocardiogram can provide important information about the size and shape of your heart, heart muscle function, heart valve function, and fluid buildup around your heart.  You do not need to do anything to prepare before this procedure. You may eat and drink normally.  After the echocardiogram is completed, you may return to your normal, everyday life, unless your health care provider tells you not to do that. This information is not intended to replace advice given to you by your health care provider. Make sure you discuss any questions you have with your health care provider. Document Released: 06/26/2000 Document Revised: 10/20/2018 Document Reviewed: 08/01/2016 Elsevier Patient Education  2020 [employer].

## 2019-02-20 NOTE — Addendum Note (Signed)
Addended by: Stevan Born on: 02/20/2019 02:38 PM   Modules accepted: Orders

## 2019-02-22 ENCOUNTER — Encounter: Payer: Self-pay | Admitting: Cardiology

## 2019-02-22 NOTE — Addendum Note (Signed)
Addended by: Shirlee More on: 02/22/2019 12:21 PM   Modules accepted: Orders

## 2019-03-07 LAB — COMPREHENSIVE METABOLIC PANEL
ALT: 22 IU/L (ref 0–44)
AST: 24 IU/L (ref 0–40)
Albumin/Globulin Ratio: 1.6 (ref 1.2–2.2)
Albumin: 4.4 g/dL (ref 3.8–4.8)
Alkaline Phosphatase: 63 IU/L (ref 39–117)
BUN/Creatinine Ratio: 17 (ref 10–24)
BUN: 18 mg/dL (ref 8–27)
Bilirubin Total: 0.6 mg/dL (ref 0.0–1.2)
CO2: 21 mmol/L (ref 20–29)
Calcium: 9.8 mg/dL (ref 8.6–10.2)
Chloride: 103 mmol/L (ref 96–106)
Creatinine, Ser: 1.05 mg/dL (ref 0.76–1.27)
GFR calc Af Amer: 88 mL/min/{1.73_m2} (ref >59)
GFR calc non Af Amer: 76 mL/min/{1.73_m2} (ref >59)
Globulin, Total: 2.7 g/dL (ref 1.5–4.5)
Glucose: 131 mg/dL — ABNORMAL HIGH (ref 65–99)
Potassium: 4.8 mmol/L (ref 3.5–5.2)
Sodium: 139 mmol/L (ref 134–144)
Total Protein: 7.1 g/dL (ref 6.0–8.5)

## 2019-03-07 LAB — LIPID PANEL
Chol/HDL Ratio: 2.8 ratio (ref 0.0–5.0)
Cholesterol, Total: 132 mg/dL (ref 100–199)
HDL: 48 mg/dL (ref >39)
LDL Calculated: 70 mg/dL (ref 0–99)
Triglycerides: 72 mg/dL (ref 0–149)
VLDL Cholesterol Cal: 14 mg/dL (ref 5–40)

## 2019-03-07 LAB — LIPOPROTEIN A (LPA): Lipoprotein (a): 294.9 nmol/L — ABNORMAL HIGH (ref <75.0)

## 2019-03-08 ENCOUNTER — Telehealth: Payer: Self-pay | Admitting: *Deleted

## 2019-03-08 DIAGNOSIS — I2510 Atherosclerotic heart disease of native coronary artery without angina pectoris: Secondary | ICD-10-CM

## 2019-03-08 DIAGNOSIS — E785 Hyperlipidemia, unspecified: Secondary | ICD-10-CM

## 2019-03-08 MED ORDER — REPATHA SURECLICK 140 MG/ML ~~LOC~~ SOAJ: 140.0000 mg | SUBCUTANEOUS | Status: DC

## 2019-03-08 NOTE — Telephone Encounter (Signed)
-----   Message from Richardo Priest, MD sent at 03/07/2019  4:37 PM EDT ----- Normal or stable result  His lipids are abnormal with elevated LDL above target 55 lipoprotein a very elevated and like him to start Repatha 140 mg every [redacted] weeks along with a statin

## 2019-03-08 NOTE — Telephone Encounter (Signed)
Patient informed of lab results and advised of Dr. Joya Gaskins recommendations. Patient confirmed he is taking atorvastatin 80 mg daily as prescribed. Patient is agreeable to a lipid clinic referral for new start of repatha 140 mg every 14 days to help treat cholesterol levels. Referral has been placed. Advised patient he would be contacted to schedule an appointment. Patient verbalized understanding. No further questions.

## 2019-03-13 ENCOUNTER — Other Ambulatory Visit: Payer: Self-pay | Admitting: Cardiology

## 2019-03-13 ENCOUNTER — Other Ambulatory Visit: Payer: Self-pay

## 2019-03-13 ENCOUNTER — Ambulatory Visit (INDEPENDENT_AMBULATORY_CARE_PROVIDER_SITE_OTHER): Payer: BC Managed Care – PPO

## 2019-03-13 DIAGNOSIS — I519 Heart disease, unspecified: Secondary | ICD-10-CM

## 2019-03-13 MED ORDER — LISINOPRIL 2.5 MG PO TABS: 2.5000 mg | ORAL_TABLET | Freq: Every day | ORAL | 0 refills | Status: DC

## 2019-03-13 MED ORDER — TICAGRELOR 90 MG PO TABS: 90.0000 mg | ORAL_TABLET | Freq: Two times a day (BID) | ORAL | 0 refills | Status: DC

## 2019-03-13 MED ORDER — METOPROLOL TARTRATE 25 MG PO TABS: 12.5000 mg | ORAL_TABLET | Freq: Two times a day (BID) | ORAL | 0 refills | Status: DC

## 2019-03-13 MED ORDER — ATORVASTATIN CALCIUM 80 MG PO TABS: 80.0000 mg | ORAL_TABLET | Freq: Every day | ORAL | 0 refills | Status: DC

## 2019-03-13 NOTE — Telephone Encounter (Signed)
All medications sent to pharmacy as requested

## 2019-03-13 NOTE — Progress Notes (Signed)
Complete echocardiogram has been performed.  Jimmy Walta [place] RDCS, RVT 

## 2019-03-13 NOTE — Addendum Note (Signed)
Addended by: Stevan Born on: 03/13/2019 10:52 AM   Modules accepted: Orders

## 2019-03-13 NOTE — Telephone Encounter (Signed)
°*  STAT* If patient is at the pharmacy, call can be transferred to refill team.   1. Which medications need to be refilled? (please list name of each medication and dose if known) Lisiopril 2.5mg  takes 1 tab daily   2. Which pharmacy/location (including street and city if local pharmacy) is medication to be sent to?[facility]  3. Do they need a 30 day or 90 day supply? 90   *STAT* If patient is at the pharmacy, call can be transferred to refill team.   1. Which medications need to be refilled? (please list name of each medication and dose if known) Atorvastatin 80 mg takes 1 daily   2. Which pharmacy/location (including street and city if local pharmacy) is medication to be sent to?[facility]  3. Do they need a 30 day or 90 day supply? 90   *STAT* If patient is at the pharmacy, call can be transferred to refill team.   1. Which medications need to be refilled? (please list name of each medication and dose if known) Brilinta 90mg  takes 1 tab twice daily   2. Which pharmacy/location (including street and city if local pharmacy) is medication to be sent to?[facility]   3. Do they need a 30 day or 90 day supply? 90   *STAT* If patient is at the pharmacy, call can be transferred to refill team.   1. Which medications need to be refilled? (please list name of each medication and dose if known) Metoprolol 25mg  takes 1/2 tablet twice daily   2. Which pharmacy/location (including street and city if local pharmacy) is medication to be sent to?[facility]   3. Do they need a 30 day or 90 day supply? [place]

## 2019-03-29 ENCOUNTER — Telehealth: Payer: Self-pay

## 2019-03-29 NOTE — Telephone Encounter (Signed)
Left message for patient to return call regarding Metoprolol.

## 2019-03-30 NOTE — Telephone Encounter (Signed)
Patient returned call to our office stating that he is taking Metoprolol 25mg  take 1/2 tablet twice daily.  Cardiac Rehab faxed over EKG strips during exercise with his heart rate increasing to 150.    Please advise

## 2019-03-30 NOTE — Telephone Encounter (Signed)
Double the metoprololdose please.  Thank you

## 2019-03-31 MED ORDER — METOPROLOL TARTRATE 25 MG PO TABS: 25.0000 mg | ORAL_TABLET | Freq: Two times a day (BID) | ORAL | 0 refills | Status: DC

## 2019-03-31 NOTE — Telephone Encounter (Signed)
Information relayed patient will take 25 mg BID per Dr. Docia Furl request. He does not have a home BP cuff, RN advised him to purchase OTC unit to morning BP,HR at home.

## 2019-04-12 ENCOUNTER — Telehealth: Payer: Self-pay | Admitting: Cardiology

## 2019-04-12 NOTE — Telephone Encounter (Signed)
Is scheduled next week to start [place] but his insurance does not cover it

## 2019-04-12 NOTE — Telephone Encounter (Signed)
Called and explained to patient that he is scheduled for an appointment with the [facility] next Tuesday, 04/18/2019, at 2:00 pm and during that appointment repatha education and insurance approval will be discussed. Patient is agreeable and verbalized understanding. Reminded patient of his follow up appointment with Dr. Bettina Gavia on Wednesday, 04/19/2019, at 2:55 pm in the [place] office. No further questions.

## 2019-04-17 NOTE — Progress Notes (Signed)
Cardiology Office Note:    Date:  04/19/2019   ID:  Samuel [name], DOB 04-06-1956, MRN [id number]  PCP:  Lise Auer, MD  Cardiologist:  Norman Herrlich, MD    Referring MD: Lise Auer, MD    ASSESSMENT:    1. CAD in native artery   2. LV dysfunction   3. Hyperlipidemia with target LDL less than 70   4. Elevated lipoprotein A level   5. Nonrheumatic aortic valve insufficiency    PLAN:    In order of problems listed above:  1. Stable CAD is a very fortunate course with resolution of LV dysfunction is New [place] Heart Association class I continue medical therapy including dual antiplatelet therapy uninterrupted 1 year beta-blocker ACE inhibitor and combined lipid-lowering therapy with statin and PCSK9 with elevated lipoprotein a level.  I asked him to have his siblings be screened. 2. LV dysfunction is resolved continue current guideline directed therapy including ACE 3. Continue combined lipid-lowering therapy next visit check lipids LPA level 4. He has mild aortic regurgitation.   Next appointment: 3 months   Medication Adjustments/Labs and Tests Ordered: Current medicines are reviewed at length with the patient today.  Concerns regarding medicines are outlined above.  No orders of the defined types were placed in this encounter.  No orders of the defined types were placed in this encounter.   No chief complaint on file.   History of Present Illness:    Samuel [name] is a 63 y.o. male with a hx of  anterior ST elevation MI in 02/12/2019 with PCI and drug-eluting stent to left anterior descending coronary artery ejection fraction 45 to 50% with akinesia of the apex and apical septum, inferior and anterior's segments  During his stay he had acute kidney injury with a creatinine elevated to 1.46 and had shortness of breath attributed to Brilinta but he was given a prescription for as needed furosemide at discharge.  He has hyper Lpa and is presently on both PCSK9 and a statin.  Ref Range & Units 1mo ago  Lipoprotein (a) <75.0 nmol/L 294.9High       Coronary/Graft Acute MI Revascularization  LEFT HEART CATH AND CORONARY ANGIOGRAPHY  Conclusion    Culprit lesion: Mid LAD lesion is 100% stenosed.  A drug-eluting stent was successfully placed using a STENT RESOLUTE ONYX [id number].--Postdilated to 3.0 mm  Post intervention, there is a 0% residual stenosis.  ------------------------------------  1st Diag lesion is 50% stenosed.  Prox RCA lesion is 30% stenosed.  Mid Cx lesion is 20% stenosed with 40% stenosed side branch in LPAV.  LPAV lesion is 40% stenosed with 40% stenosed side branch in 1st LPL.  ------------------------------------  There is mild left ventricular systolic dysfunction. The left ventricular ejection fraction is 35-45% by visual estimate.  LV end diastolic pressure is mildly elevated.   SUMMARY  Severe single-vessel disease of 100% occlusion of the mid LAD just after first diagonal branch.  Successful DES PCI with Resolute Onyx 2.75 mm x 18 mm (postdilated to 3.0 mm)  Mildly reduced LVEF with anterior and inferior apical hypokinesis.  Mildly elevated LVEDP.      Echo 02/13/19 IMPRESSIONS  1. The left ventricle has mildly reduced systolic function, with an ejection fraction of 45-50%. The cavity size was normal. Left ventricular diastolic Doppler parameters are consistent with pseudonormalization. Akinesis of the true apex and the apical  septal, apical anterior, and apical inferior wall segments.  2. The right ventricle has normal systolic function. The  cavity was normal. There is no increase in right ventricular wall thickness.  3. The aortic valve is tricuspid. Severe calcifcation of the aortic valve. Aortic valve regurgitation is trivial by color flow Doppler. Despite significant valvular calcification, there does not appear to be significant aortic stenosis.  4. The aorta is normal in size and structure.  5. No evidence of mitral  valve stenosis. No significant mitral regurgitation.  6. The IVC was normal in size. No complete TR doppler jet so unable to estimate PA systolic pressure.   Echo 03/13/2019: 1. The left ventricle has normal systolic function with an ejection fraction of 60-65%. The cavity size was normal. Left ventricular diastolic Doppler parameters are consistent with impaired relaxation.  2. The right ventricle has normal systolic function. The cavity was normal. There is no increase in right ventricular wall thickness.  3. The mitral valve is grossly normal.  4. The aortic valve is abnormal. Moderate sclerosis of the aortic valve. Aortic valve regurgitation is mild by color flow Doppler.  5. The aorta is normal unless otherwise noted.  He was last seen 02/20/2019.  Compliance with diet, lifestyle and medications: Yes  Unsure of the results of his echocardiogram and is quite pleased.  He is bicycling in his ongoing cardiac rehabilitation is having no problems.  He did require higher dose of beta-blocker during supervised exercise.  He has had no angina palpitation or syncope and he will start PCSK9 therapy Friday.  I will plan to see back in 3 months and check a lipid profile including LPA level.  No edema palpitation chest discomfort. Past Medical History:  Diagnosis Date  . Acute MI, anterior wall (HCC) 02/12/2019  . CAD (coronary artery disease), native coronary artery    a. cath 02/2019 S/p DES to mLAD for 100% stenosis   . Hyperlipidemia LDL goal <70   . Ischemic cardiomyopathy     Past Surgical History:  Procedure Laterality Date  . CARDIAC CATHETERIZATION    . CORONARY/GRAFT ACUTE MI REVASCULARIZATION N/A 02/12/2019   Procedure: Coronary/Graft Acute MI Revascularization;  Surgeon: Marykay LexHarding, David W, MD;  [facility];  Service: Cardiovascular;  Laterality: N/A;  . HAND SURGERY     right  . LEFT HEART CATH AND CORONARY ANGIOGRAPHY N/A 02/12/2019   Procedure: LEFT HEART CATH AND  CORONARY ANGIOGRAPHY;  Surgeon: Marykay LexHarding, David W, MD;  [facility];  Service: Cardiovascular;  Laterality: N/A;    Current Medications: No outpatient medications have been marked as taking for the 04/19/19 encounter (Appointment) with Baldo DaubMunley, Devri Kreher J, MD.     Allergies:   Sulfa antibiotics   Social History   Socioeconomic History  . Marital status: Married    Spouse name: Not on file  . Number of children: Not on file  . Years of education: Not on file  . Highest education level: Not on file  Occupational History  . Not on file  Social Needs  . Financial resource strain: Not on file  . Food insecurity    Worry: Not on file    Inability: Not on file  . Transportation needs    Medical: Not on file    Non-medical: Not on file  Tobacco Use  . Smoking status: Never Smoker  . Smokeless tobacco: Never Used  Substance and Sexual Activity  . Alcohol use: No    Alcohol/week: 0.0 standard drinks  . Drug use: No  . Sexual activity: Not on file  Lifestyle  . Physical  activity    Days per week: Not on file    Minutes per session: Not on file  . Stress: Not on file  Relationships  . Social Herbalist on phone: Not on file    Gets together: Not on file    Attends religious service: Not on file    Active member of club or organization: Not on file    Attends meetings of clubs or organizations: Not on file    Relationship status: Not on file  Other Topics Concern  . Not on file  Social History Narrative   Married   Patient is very active, he walks usually 2 to 4 miles a day, and bikes 20 to 30 miles 2 times a week.   Non-smoker     Family History: The patient's family history includes CAD in his paternal uncle; CAD (age of onset: 66) in his father; Diabetes in his sister; Heart Problems in his brother; Heart attack in his mother; Hypertension in his mother and sister; Parkinson's disease in his mother. ROS:   Please see the history of present  illness.    All other systems reviewed and are negative.  EKGs/Labs/Other Studies Reviewed:    The following studies were reviewed today:    Recent Labs: 02/12/2019: Hemoglobin 13.2; Platelets 220 03/06/2019: ALT 22; BUN 18; Creatinine, Ser 1.05; Potassium 4.8; Sodium 139  Recent Lipid Panel    Component Value Date/Time   CHOL 132 03/06/2019 0838   TRIG 72 03/06/2019 0838   HDL 48 03/06/2019 0838   CHOLHDL 2.8 03/06/2019 0838   CHOLHDL 2.9 02/12/2019 1231   VLDL 24 02/12/2019 1231   LDLCALC 70 03/06/2019 0838    Physical Exam:    VS:  There were no vitals taken for this visit.    Wt Readings from Last 3 Encounters:  04/18/19 154 lb 9.6 oz (70.1 kg)  02/20/19 154 lb (69.9 kg)  02/14/19 157 lb 3.2 oz (71.3 kg)     GEN:  Well nourished, well developed in no acute distress HEENT: Normal NECK: No JVD; No carotid bruits LYMPHATICS: No lymphadenopathy CARDIAC: RRR, no murmurs, rubs, gallops RESPIRATORY:  Clear to auscultation without rales, wheezing or rhonchi  ABDOMEN: Soft, non-tender, non-distended MUSCULOSKELETAL:  No edema; No deformity  SKIN: Warm and dry NEUROLOGIC:  Alert and oriented x 3 PSYCHIATRIC:  Normal affect    Signed, Shirlee More, MD  04/19/2019 1:14 PM    [facility]

## 2019-04-18 ENCOUNTER — Other Ambulatory Visit: Payer: Self-pay

## 2019-04-18 ENCOUNTER — Ambulatory Visit (INDEPENDENT_AMBULATORY_CARE_PROVIDER_SITE_OTHER): Payer: BC Managed Care – PPO | Admitting: Pharmacist

## 2019-04-18 VITALS — BP 112/60 | HR 60 | Ht 67.0 in | Wt 154.6 lb

## 2019-04-18 DIAGNOSIS — E785 Hyperlipidemia, unspecified: Secondary | ICD-10-CM | POA: Diagnosis not present

## 2019-04-18 MED ORDER — PRALUENT 75 MG/ML ~~LOC~~ SOAJ: 75.0000 mg | SUBCUTANEOUS | 0 refills | Status: DC

## 2019-04-18 NOTE — Progress Notes (Signed)
Patient ID: Samuel [name]                 DOB: January 10, 1956                    MRN: [id number]     HPI:  Samuel [name] is a 63 y.o. male patient referred to lipid clinic by Dr Dulce Sellar. PMH is significant for LV dysfunction, and  hypercholesterolemia s/p STEMI. Patient currently on high intensity statin with LDL-c above 70mg /dL and Lp(a) . Per Dr 951.8ACZY/S his LDL target is 55mg /dL. Patient presents today for potential PCSK9i initiation and denies problems with currnet therapy  Current Medications:  Atorvastatin 80mg  daily  Intolerances: none  LDL goal: 55mg /dL per DR Dulce Sellar  Diet: 1-2 meatless per weeks, 1 fish   Exercise: bike 30 miles twice weekly and daily walks  Family History:The patient's family history includes CAD in his paternal uncle; CAD (age of onset: 3) in his father; Diabetes in his sister; Heart Problems in his brother; Heart attack in his mother; Hypertension in his mother and sister; Parkinson's disease in his mother.  Social History: denies tobacco use, denies alcohol use  Labs: 02/12/2019: CHO 162; TG 120; HDL 55, LDL-c 83 (atorvastatin 80mg  daily)   Past Medical History:  Diagnosis Date  . Acute MI, anterior wall (HCC) 02/12/2019  . CAD (coronary artery disease), native coronary artery    a. cath 02/2019 S/p DES to mLAD for 100% stenosis   . Hyperlipidemia LDL goal <70   . Ischemic cardiomyopathy     Current Outpatient Medications on File Prior to Visit  Medication Sig Dispense Refill  . aspirin 81 MG chewable tablet Chew 1 tablet (81 mg total) by mouth daily.    44 atorvastatin (LIPITOR) 80 MG tablet Take 1 tablet (80 mg total) by mouth daily. 90 tablet 0  . Fexofenadine HCl (ALLEGRA PO) Take 1 tablet by mouth daily.     . fluticasone (FLONASE) 50 MCG/ACT nasal spray Place 1 spray into both nostrils daily.    04/14/2019 ibuprofen (ADVIL) 200 MG tablet Take 200 mg by mouth every 6 (six) hours as needed for mild pain.    lisinopril (ZESTRIL) 2.5 MG tablet Take 1 tablet  (2.5 mg total) by mouth daily. 90 tablet 0  . metoprolol tartrate (LOPRESSOR) 25 MG tablet Take 1 tablet (25 mg total) by mouth 2 (two) times daily. 90 tablet 0  . nitroGLYCERIN (NITROSTAT) 0.4 MG SL tablet Place 1 tablet (0.4 mg total) under the tongue every 5 (five) minutes as needed. 25 tablet 12  . ticagrelor (BRILINTA) 90 MG TABS tablet Take 1 tablet (90 mg total) by mouth 2 (two) times daily. 180 tablet 0   No current facility-administered medications on file prior to visit.     Allergies  Allergen Reactions  . Sulfa Antibiotics Other (See Comments)    Pt unable to recall reaction    Hyperlipidemia with target LDL less than 70 LDL remains above goal of 55mg /dL for secondary prevention on high risk patient. Noted strong family history of early ASCVD, hx of STEMI and Lp(a) at 65.    Repatha will NOT be covered by his current insurance, prleunt is theprefer formulary alternative. Due to baseline LDL of 83mg /dL, I will initiate PA for Praluent 75mg  [place] every 14 days.  PCSK9i indication, storage, administration, common side effects, monitoring and available patient assistance were discussed during the visit.Samples for 28 days were provided during this visit as well.  Samuel [name] PharmD, BCPS, [place] [address] [place] [id number] 04/19/2019 6:29 PM

## 2019-04-18 NOTE — Patient Instructions (Addendum)
Lipid Clinic (pharmacist) [phone] Daniyal Tabor/Kristin  *START Praluent 75mg  every 14 days* *Contact clinic for additional information if unable to afford mediation*    High Cholesterol  High cholesterol is a condition in which the blood has high levels of a white, waxy, fat-like substance (cholesterol). The human body needs small amounts of cholesterol. The liver makes all the cholesterol that the body needs. Extra (excess) cholesterol comes from the food that we eat. Cholesterol is carried from the liver by the blood through the blood vessels. If you have high cholesterol, deposits (plaques) may build up on the walls of your blood vessels (arteries). Plaques make the arteries narrower and stiffer. Cholesterol plaques increase your risk for heart attack and stroke. Work with your health care provider to keep your cholesterol levels in a healthy range. What increases the risk? This condition is more likely to develop in people who:  Eat foods that are high in animal fat (saturated fat) or cholesterol.  Are overweight.  Are not getting enough exercise.  Have a family history of high cholesterol. What are the signs or symptoms? There are no symptoms of this condition. How is this diagnosed? This condition may be diagnosed from the results of a blood test.  If you are older than age 20, your health care provider may check your cholesterol every 4-6 years.  You may be checked more often if you already have high cholesterol or other risk factors for heart disease. The blood test for cholesterol measures:  "Bad" cholesterol (LDL cholesterol). This is the main type of cholesterol that causes heart disease. The desired level for LDL is less than 100.  "Good" cholesterol (HDL cholesterol). This type helps to protect against heart disease by cleaning the arteries and carrying the LDL away. The desired level for HDL is 60 or higher.  Triglycerides. These are fats that the body can store or  burn for energy. The desired number for triglycerides is lower than 150.  Total cholesterol. This is a measure of the total amount of cholesterol in your blood, including LDL cholesterol, HDL cholesterol, and triglycerides. A healthy number is less than 200. How is this treated? This condition is treated with diet changes, lifestyle changes, and medicines. Diet changes  This may include eating more whole grains, fruits, vegetables, nuts, and fish.  This may also include cutting back on red meat and foods that have a lot of added sugar. Lifestyle changes  Changes may include getting at least 40 minutes of aerobic exercise 3 times a week. Aerobic exercises include walking, biking, and swimming. Aerobic exercise along with a healthy diet can help you maintain a healthy weight.  Changes may also include quitting smoking. Medicines  Medicines are usually given if diet and lifestyle changes have failed to reduce your cholesterol to healthy levels.  Your health care provider may prescribe a statin medicine. Statin medicines have been shown to reduce cholesterol, which can reduce the risk of heart disease. Follow these instructions at home: Eating and drinking If told by your health care provider:  Eat chicken (without skin), fish, veal, shellfish, ground [place] breast, and round or loin cuts of red meat.  Do not eat fried foods or fatty meats, such as hot dogs and salami.  Eat plenty of fruits, such as apples.  Eat plenty of vegetables, such as broccoli, potatoes, and carrots.  Eat beans, peas, and lentils.  Eat grains such as barley, rice, couscous, and bulgur wheat.  Eat pasta without cream sauces.  Use  skim or nonfat milk, and eat low-fat or nonfat yogurt and cheeses.  Do not eat or drink whole milk, cream, ice cream, egg yolks, or hard cheeses.  Do not eat stick margarine or tub margarines that contain trans fats (also called partially hydrogenated oils).  Do not eat  saturated tropical oils, such as coconut oil and palm oil.  Do not eat cakes, cookies, crackers, or other baked goods that contain trans fats.  General instructions  Exercise as directed by your health care provider. Increase your activity level with activities such as gardening, walking, and taking the stairs.  Take over-the-counter and prescription medicines only as told by your health care provider.  Do not use any products that contain nicotine or tobacco, such as cigarettes and e-cigarettes. If you need help quitting, ask your health care provider.  Keep all follow-up visits as told by your health care provider. This is important. Contact a health care provider if:  You are struggling to maintain a healthy diet or weight.  You need help to start on an exercise program.  You need help to stop smoking. Get help right away if:  You have chest pain.  You have trouble breathing. This information is not intended to replace advice given to you by your health care provider. Make sure you discuss any questions you have with your health care provider. Document Released: 06/29/2005 Document Revised: 07/02/2017 Document Reviewed: 12/28/2015 Elsevier Patient Education  2020 [employer].

## 2019-04-19 ENCOUNTER — Ambulatory Visit (INDEPENDENT_AMBULATORY_CARE_PROVIDER_SITE_OTHER): Payer: BC Managed Care – PPO | Admitting: Cardiology

## 2019-04-19 ENCOUNTER — Encounter: Payer: Self-pay | Admitting: Cardiology

## 2019-04-19 VITALS — BP 122/68 | HR 91 | Ht 67.0 in | Wt 154.0 lb

## 2019-04-19 DIAGNOSIS — I351 Nonrheumatic aortic (valve) insufficiency: Secondary | ICD-10-CM | POA: Insufficient documentation

## 2019-04-19 DIAGNOSIS — I2510 Atherosclerotic heart disease of native coronary artery without angina pectoris: Secondary | ICD-10-CM | POA: Diagnosis not present

## 2019-04-19 DIAGNOSIS — E785 Hyperlipidemia, unspecified: Secondary | ICD-10-CM

## 2019-04-19 DIAGNOSIS — E7841 Elevated Lipoprotein(a): Secondary | ICD-10-CM | POA: Insufficient documentation

## 2019-04-19 DIAGNOSIS — I519 Heart disease, unspecified: Secondary | ICD-10-CM | POA: Diagnosis not present

## 2019-04-19 HISTORY — DX: Nonrheumatic aortic (valve) insufficiency: I35.1

## 2019-04-19 HISTORY — DX: Elevated lipoprotein(a): E78.41

## 2019-04-19 NOTE — Assessment & Plan Note (Signed)
LDL remains above goal of 55mg /dL for secondary prevention on high risk patient. Noted strong family history of early ASCVD, hx of STEMI and Lp(a) at 42.    Repatha will NOT be covered by his current insurance, prleunt is theprefer formulary alternative. Due to baseline LDL of 83mg /dL, I will initiate PA for Praluent 75mg  [place] every 14 days.  PCSK9i indication, storage, administration, common side effects, monitoring and available patient assistance were discussed during the visit.Samples for 28 days were provided during this visit as well.

## 2019-04-19 NOTE — Patient Instructions (Signed)
Medication Instructions:  Your physician recommends that you continue on your current medications as directed. Please refer to the Current Medication list given to you today.  If you need a refill on your cardiac medications before your next appointment, please call your pharmacy.   Lab work: None   If you have labs (blood work) drawn today and your tests are completely normal, you will receive your results only by: . MyChart Message (if you have MyChart) OR . A paper copy in the mail If you have any lab test that is abnormal or we need to change your treatment, we will call you to review the results.  Testing/Procedures: None  Follow-Up: At [facility], you and your health needs are our priority.  As part of our continuing mission to provide you with exceptional heart care, we have created designated Provider Care Teams.  These Care Teams include your primary Cardiologist (physician) and Advanced Practice Providers (APPs -  Physician Assistants and Nurse Practitioners) who all work together to provide you with the care you need, when you need it. You will need a follow up appointment in 3 months.  Please call our office 2 months in advance to schedule this appointment.     

## 2019-04-27 ENCOUNTER — Telehealth: Payer: Self-pay

## 2019-04-27 NOTE — Telephone Encounter (Signed)
Called and spoke with pt regarding approval of praluent and instructed the pt to pick it up at the pharmacy and if the cost is unaffordable lmk and we will see what we can do about getting pt assistance possibly

## 2019-05-01 ENCOUNTER — Telehealth: Payer: Self-pay | Admitting: Pharmacist

## 2019-05-01 MED ORDER — PRALUENT 75 MG/ML ~~LOC~~ SOAJ: 75.0000 mg | SUBCUTANEOUS | 11 refills | Status: DC

## 2019-05-01 NOTE — Telephone Encounter (Signed)
Praluent Rx sent and waiting for patient to reply if assistance needed.   fasting blood work needed in 2-3 months

## 2019-05-05 ENCOUNTER — Other Ambulatory Visit: Payer: Self-pay | Admitting: Pharmacist Clinician (PhC)/ Clinical Pharmacy Specialist

## 2019-05-05 MED ORDER — PRALUENT 75 MG/ML ~~LOC~~ SOAJ: 75.0000 mg | SUBCUTANEOUS | 11 refills | Status: DC

## 2019-05-24 ENCOUNTER — Telehealth: Payer: Self-pay | Admitting: Cardiology

## 2019-05-24 MED ORDER — METOPROLOL TARTRATE 25 MG PO TABS: 12.5000 mg | ORAL_TABLET | Freq: Two times a day (BID) | ORAL | 0 refills | Status: DC

## 2019-05-24 NOTE — Telephone Encounter (Signed)
Please call regarding medication questions. °

## 2019-05-24 NOTE — Telephone Encounter (Signed)
Called patient who reports that during cardiac rehab at [facility] last Wednesday, 05/17/2019, Dr. Bettina Gavia was contacted due to orthostatic hypotension. He was told to stop his lisinopril.  Patient states that his blood pressure has continued to remain low since stopping lisinopril and he is concerned. He is taking all his medications as prescribed. His most recent BP readings are as follows:   Yesterday morning: BP 101/59 & HR 69 Yesterday midday: BP 110/? & unknown HR Yesterday evening: BP 112/58 HR 63 This morning: BP 98/54 & HR 63  Spoke with Dr. Bettina Gavia who advised for patient to decrease metoprolol tartrate from 25 mg twice daily to 12.5 mg twice daily. Patient advised to continue monitoring his BP and HR and keep a log of these readings. Patient is agreeable and will contact our office with any further questions or concerns.

## 2019-06-11 ENCOUNTER — Other Ambulatory Visit: Payer: Self-pay | Admitting: Cardiology

## 2019-06-12 ENCOUNTER — Other Ambulatory Visit: Payer: Self-pay | Admitting: Cardiology

## 2019-06-12 NOTE — Telephone Encounter (Signed)
Metoprolol refill sent to [facility] in [place].

## 2019-06-12 NOTE — Telephone Encounter (Signed)
Atorvastatin and Brilinta refills sent to [facility] in [place].

## 2019-07-08 ENCOUNTER — Other Ambulatory Visit: Payer: Self-pay | Admitting: Cardiology

## 2019-07-12 ENCOUNTER — Telehealth: Payer: Self-pay

## 2019-07-12 DIAGNOSIS — E785 Hyperlipidemia, unspecified: Secondary | ICD-10-CM

## 2019-07-12 NOTE — Addendum Note (Signed)
Addended by: Allean Found on: 07/12/2019 02:28 PM   Modules accepted: Orders

## 2019-07-12 NOTE — Telephone Encounter (Signed)
Returned a missed phone call to pt but lmomed him will await a call back

## 2019-07-12 NOTE — Telephone Encounter (Signed)
Called and lmomed the pt stating to call us back to discuss how they are tolerating praluent and also to set up lipid labs.

## 2019-07-12 NOTE — Telephone Encounter (Signed)
Pt called stating that they are tolerating praluent set up labs in [place]

## 2019-07-20 NOTE — Progress Notes (Signed)
Virtual Visit via Telephone Note   This visit type was conducted due to national recommendations for restrictions regarding the COVID-19 Pandemic (e.g. social distancing) in an effort to limit this patient's exposure and mitigate transmission in our community.  Due to his co-morbid illnesses, this patient is at least at moderate risk for complications without adequate follow up.  This format is felt to be most appropriate for this patient at this time.  The patient did not have access to video technology/had technical difficulties with video requiring transitioning to audio format only (telephone).  All issues noted in this document were discussed and addressed.  No physical exam could be performed with this format.  Please refer to the patient's chart for his  consent to telehealth for [facility]. Date:  07/21/2019    Unfortunately does not have access to a laptop desktop or smart phone.  15 minutes spent contact with patient during this visit.  He had many questions regarding his activities physical exercise interval training over-the-counter supplements need for compliance of medication arrangements for follow-up labs and EKG and office visit ID:  Samuel [name], Samuel [name] 1955/08/19, MRN [id number]  PCP:  Lise Auer, MD  Cardiologist:  Norman Herrlich, MD    Referring MD: Lise Auer, MD    ASSESSMENT:    1. CAD in native artery   2. Hyperlipidemia with target LDL less than 70   3. Elevated lipoprotein A level    PLAN:    In order of problems listed above:  1. Stable CAD after PCI and stent LAD for evolving anterior MI LV function normalized he is compliant with medications including intensive lipid-lowering with PCSK9 and dual antiplatelet. 2. Continue current treatment check CMP lipid profile and LP(a) level it he will continue both Praluent and high intensity statin and when available is appropriate for the oligonucleotide direct therapy for LP(a) access 3. See above   Next  appointment: 3 months   Medication Adjustments/Labs and Tests Ordered: Current medicines are reviewed at length with the patient today.  Concerns regarding medicines are outlined above.  No orders of the defined types were placed in this encounter.  No orders of the defined types were placed in this encounter.   FU for CAD  History of Present Illness:    Samuel [name] is a 64 y.o. male with a hx of anterior ST elevation MI in 02/12/2019 with PCI and drug-eluting stent to left anterior descending coronary artery ejection fraction 45 to 50% with akinesia of the apex and apical septum, inferior and anterior's segments   During his stay he had acute kidney injury with a creatinine elevated to 1.46 and had shortness of breath attributed to Brilinta but he was given a prescription for as needed furosemide at discharge.   He has hyper Lpa and is presently on both PCSK9 and a statin.   Ref Range & Units 56mo ago  Lipoprotein (a) <75.0 nmol/L 294.9High         Coronary/Graft Acute MI Revascularization  LEFT HEART CATH AND CORONARY ANGIOGRAPHY  Conclusion    Culprit lesion: Mid LAD lesion is 100% stenosed.  A drug-eluting stent was successfully placed using a STENT RESOLUTE ONYX [id number].--Postdilated to 3.0 mm  Post intervention, there is a 0% residual stenosis.  ------------------------------------  1st Diag lesion is 50% stenosed.  Prox RCA lesion is 30% stenosed.  Mid Cx lesion is 20% stenosed with 40% stenosed side branch in LPAV.  LPAV lesion  is 40% stenosed with 40% stenosed side branch in 1st LPL.  ------------------------------------  There is mild left ventricular systolic dysfunction. The left ventricular ejection fraction is 35-45% by visual estimate.  LV end diastolic pressure is mildly elevated.   SUMMARY  Severe single-vessel disease of 100% occlusion of the mid LAD just after first diagonal branch.  Successful DES PCI with Resolute Onyx 2.75 mm x 18 mm  (postdilated to 3.0 mm)  Mildly reduced LVEF with anterior and inferior apical hypokinesis.  Mildly elevated LVEDP.      Echo 02/13/19 IMPRESSIONS  1. The left ventricle has mildly reduced systolic function, with an ejection fraction of 45-50%. The cavity size was normal. Left ventricular diastolic Doppler parameters are consistent with pseudonormalization. Akinesis of the true apex and the apical  septal, apical anterior, and apical inferior wall segments.  2. The right ventricle has normal systolic function. The cavity was normal. There is no increase in right ventricular wall thickness.  3. The aortic valve is tricuspid. Severe calcifcation of the aortic valve. Aortic valve regurgitation is trivial by color flow Doppler. Despite significant valvular calcification, there does not appear to be significant aortic stenosis.  4. The aorta is normal in size and structure.  5. No evidence of mitral valve stenosis. No significant mitral regurgitation.  6. The IVC was normal in size. No complete TR doppler jet so unable to estimate PA systolic pressure.    Echo 03/13/2019: 1. The left ventricle has normal systolic function with an ejection fraction of 60-65%. The cavity size was normal. Left ventricular diastolic Doppler parameters are consistent with impaired relaxation.  2. The right ventricle has normal systolic function. The cavity was normal. There is no increase in right ventricular wall thickness.  3. The mitral valve is grossly normal.  4. The aortic valve is abnormal. Moderate sclerosis of the aortic valve. Aortic valve regurgitation is mild by color flow Doppler.  5. The aorta is normal unless otherwise noted.   He was last seen 04/19/2019 Compliance with diet, lifestyle and medications: Yes  He has had no chest pain, SOB,palpitation or syncope. He tolerates his PCSK9 without side effects. He completed cardiac rehab at [facility].  Overall he has done well and is pleased with the quality of his  life he does some interval training on the bike and at times exceeds target heart rate but is brief for a few minutes I told him I think it safe in his case.  He has LPA excess and requires follow-up labs next week CMP lipid profile LP(a) we will do an office EKG without the need for an appointment and I will see him in 3 months.  I stressed the need with him to be compliant with his dual antiplatelet therapy.  Home blood pressures are running 120-125/70-75 and has had interval sinus infection treated as an outpatient.   Past Medical History:  Diagnosis Date  . Acute MI, anterior wall ([place]) 02/12/2019  . CAD (coronary artery disease), native coronary artery    a. cath 02/2019 S/p DES to mLAD for 100% stenosis   . Hyperlipidemia LDL goal <70   . Ischemic cardiomyopathy     Past Surgical History:  Procedure Laterality Date  . CARDIAC CATHETERIZATION    . CORONARY/GRAFT ACUTE MI REVASCULARIZATION N/A 02/12/2019   Procedure: Coronary/Graft Acute MI Revascularization;  Surgeon: Leonie Man, MD;  Location: [place] [facility];  Service: Cardiovascular;  Laterality: N/A;  . HAND SURGERY     right  .  LEFT HEART CATH AND CORONARY ANGIOGRAPHY N/A 02/12/2019   Procedure: LEFT HEART CATH AND CORONARY ANGIOGRAPHY;  Surgeon: Marykay Lex, MD;  Location: [facility] INVASIVE CV LAB;  Service: Cardiovascular;  Laterality: N/A;    Current Medications: Current Meds  Medication Sig  . Alirocumab (PRALUENT) 75 MG/ML SOAJ Inject 75 mg into the skin every 14 (fourteen) days.  Marland Kitchen aspirin 81 MG chewable tablet Chew 1 tablet (81 mg total) by mouth daily.  Marland Kitchen atorvastatin (LIPITOR) 80 MG tablet TAKE 1 TABLET BY MOUTH EVERY DAY  . BRILINTA 90 MG TABS tablet TAKE 1 TABLET BY MOUTH 2 TIMES DAILY.  Marland Kitchen Fexofenadine HCl (ALLEGRA PO) Take 1 tablet by mouth daily.   . fluticasone (FLONASE) 50 MCG/ACT nasal spray Place 1 spray into both nostrils daily.  Marland Kitchen ibuprofen (ADVIL) 200 MG tablet Take 200 mg by mouth every 6 (six) hours  as needed for mild pain.  . metoprolol tartrate (LOPRESSOR) 25 MG tablet TAKE 1/2 TABLET (12.5 MG TOTAL) BY MOUTH 2 TIMES DAILY.  . nitroGLYCERIN (NITROSTAT) 0.4 MG SL tablet Place 1 tablet (0.4 mg total) under the tongue every 5 (five) minutes as needed.     Allergies:   Sulfa antibiotics   Social History   Socioeconomic History  . Marital status: Married    Spouse name: Not on file  . Number of children: Not on file  . Years of education: Not on file  . Highest education level: Not on file  Occupational History  . Not on file  Tobacco Use  . Smoking status: Never Smoker  . Smokeless tobacco: Never Used  Substance and Sexual Activity  . Alcohol use: No    Alcohol/week: 0.0 standard drinks  . Drug use: No  . Sexual activity: Not on file  Other Topics Concern  . Not on file  Social History Narrative   Married   Patient is very active, he walks usually 2 to 4 miles a day, and bikes 20 to 30 miles 2 times a week.   Non-smoker   Social Determinants of Health   Financial Resource Strain:   . Difficulty of Paying Living Expenses: Not on file  Food Insecurity:   . Worried About Programme researcher, broadcasting/film/video in the Last Year: Not on file  . Ran Out of Food in the Last Year: Not on file  Transportation Needs:   . Lack of Transportation (Medical): Not on file  . Lack of Transportation (Non-Medical): Not on file  Physical Activity:   . Days of Exercise per Week: Not on file  . Minutes of Exercise per Session: Not on file  Stress:   . Feeling of Stress : Not on file  Social Connections:   . Frequency of Communication with Friends and Family: Not on file  . Frequency of Social Gatherings with Friends and Family: Not on file  . Attends Religious Services: Not on file  . Active Member of Clubs or Organizations: Not on file  . Attends Banker Meetings: Not on file  . Marital Status: Not on file     Family History: The patient's family history includes CAD in his paternal  uncle; CAD (age of onset: 51) in his father; Diabetes in his sister; Heart Problems in his brother; Heart attack in his mother; Hypertension in his mother and sister; Parkinson's disease in his mother. ROS:   Please see the history of present illness.    All other systems reviewed and are negative.  EKGs/Labs/Other Studies Reviewed:  The following studies were reviewed today:    Recent Labs: 02/12/2019: Hemoglobin 13.2; Platelets 220 03/06/2019: ALT 22; BUN 18; Creatinine, Ser 1.05; Potassium 4.8; Sodium 139  Recent Lipid Panel    Component Value Date/Time   CHOL 132 03/06/2019 0838   TRIG 72 03/06/2019 0838   HDL 48 03/06/2019 0838   CHOLHDL 2.8 03/06/2019 0838   CHOLHDL 2.9 02/12/2019 1231   VLDL 24 02/12/2019 1231   LDLCALC 70 03/06/2019 0838    Physical Exam:    VS:  BP (!) 112/58   Pulse 61   Ht 5\' 7"  (1.702 m)   Wt 148 lb (67.1 kg)   BMI 23.18 kg/m     Wt Readings from Last 3 Encounters:  07/21/19 148 lb (67.1 kg)  04/19/19 154 lb (69.9 kg)  04/18/19 154 lb 9.6 oz (70.1 kg)    Vital signs reviewed He is alert and oriented No respiratory distress or audible wheezing during conversation    Signed, 06/18/19, MD  07/21/2019 8:22 AM    [facility]

## 2019-07-21 ENCOUNTER — Other Ambulatory Visit: Payer: Self-pay

## 2019-07-21 ENCOUNTER — Encounter: Payer: Self-pay | Admitting: Cardiology

## 2019-07-21 ENCOUNTER — Telehealth (INDEPENDENT_AMBULATORY_CARE_PROVIDER_SITE_OTHER): Payer: BC Managed Care – PPO | Admitting: Cardiology

## 2019-07-21 VITALS — BP 112/58 | HR 61 | Ht 67.0 in | Wt 148.0 lb

## 2019-07-21 DIAGNOSIS — E7841 Elevated Lipoprotein(a): Secondary | ICD-10-CM | POA: Diagnosis not present

## 2019-07-21 DIAGNOSIS — E785 Hyperlipidemia, unspecified: Secondary | ICD-10-CM | POA: Diagnosis not present

## 2019-07-21 DIAGNOSIS — I2510 Atherosclerotic heart disease of native coronary artery without angina pectoris: Secondary | ICD-10-CM

## 2019-07-21 MED ORDER — NITROGLYCERIN 0.4 MG SL SUBL: 0.4000 mg | SUBLINGUAL_TABLET | SUBLINGUAL | 3 refills | Status: DC | PRN

## 2019-07-21 NOTE — Patient Instructions (Signed)
Medication Instructions:  Your physician recommends that you continue on your current medications as directed. Please refer to the Current Medication list given to you today.  *If you need a refill on your cardiac medications before your next appointment, please call your pharmacy*  Lab Work: Cmet & Lipids in 1 week   If you have labs (blood work) drawn today and your tests are completely normal, you will receive your results only by: Marland Kitchen MyChart Message (if you have MyChart) OR . A paper copy in the mail If you have any lab test that is abnormal or we need to change your treatment, we will call you to review the results.  Testing/Procedures: None ordered   Follow-Up: At [facility], you and your health needs are our priority.  As part of our continuing mission to provide you with exceptional heart care, we have created designated Provider Care Teams.  These Care Teams include your primary Cardiologist (physician) and Advanced Practice Providers (APPs -  Physician Assistants and Nurse Practitioners) who all work together to provide you with the care you need, when you need it.  Your next appointment:   3 month(s)  The format for your next appointment:   In Person  Provider:   Norman Herrlich, MD   Please return in 1 week for a EKG   Other Instructions None ordered

## 2019-07-28 ENCOUNTER — Encounter: Payer: Self-pay | Admitting: Cardiology

## 2019-07-28 ENCOUNTER — Other Ambulatory Visit: Payer: Self-pay

## 2019-07-28 ENCOUNTER — Ambulatory Visit (INDEPENDENT_AMBULATORY_CARE_PROVIDER_SITE_OTHER): Payer: BC Managed Care – PPO | Admitting: Cardiology

## 2019-07-28 VITALS — BP 100/60 | HR 57 | Ht 67.0 in | Wt 147.0 lb

## 2019-07-28 DIAGNOSIS — I519 Heart disease, unspecified: Secondary | ICD-10-CM

## 2019-07-28 DIAGNOSIS — I2510 Atherosclerotic heart disease of native coronary artery without angina pectoris: Secondary | ICD-10-CM

## 2019-07-28 DIAGNOSIS — E7841 Elevated Lipoprotein(a): Secondary | ICD-10-CM | POA: Diagnosis not present

## 2019-07-28 DIAGNOSIS — I351 Nonrheumatic aortic (valve) insufficiency: Secondary | ICD-10-CM

## 2019-07-28 NOTE — Patient Instructions (Signed)
Medication Instructions:  Your physician recommends that you continue on your current medications as directed. Please refer to the Current Medication list given to you today.  *If you need a refill on your cardiac medications before your next appointment, please call your pharmacy*  Lab Work: None If you have labs (blood work) drawn today and your tests are completely normal, you will receive your results only by: Marland Kitchen MyChart Message (if you have MyChart) OR . A paper copy in the mail If you have any lab test that is abnormal or we need to change your treatment, we will call you to review the results.  Testing/Procedures: nOne  Follow-Up: At [facility], you and your health needs are our priority.  As part of our continuing mission to provide you with exceptional heart care, we have created designated Provider Care Teams.  These Care Teams include your primary Cardiologist (physician) and Advanced Practice Providers (APPs -  Physician Assistants and Nurse Practitioners) who all work together to provide you with the care you need, when you need it.  Your next appointment:   3 month(s)  The format for your next appointment:   In Person  Provider:   Norman Herrlich, MD  Other Instructions

## 2019-07-28 NOTE — Progress Notes (Signed)
Cardiology Office Note:    Date:  07/28/2019   ID:  Samuel [name], DOB 02/29/1956, MRN [id number]  PCP:  Lise Auer, MD  Cardiologist:  Garwin Brothers, MD   Referring MD: Lise Auer, MD    ASSESSMENT:    1. CAD in native artery   2. LV dysfunction   3. Aortic valve insufficiency, etiology of cardiac valve disease unspecified   4. Elevated lipoprotein A level    PLAN:    In order of problems listed above:  1. Patient came in for an EKG evaluation.  He was recently seen by Dr. Dulce Sellar is known coronary artery disease with significant LV dysfunction which has normalized.   Medication Adjustments/Labs and Tests Ordered: Current medicines are reviewed at length with the patient today.  Concerns regarding medicines are outlined above.  No orders of the defined types were placed in this encounter.  No orders of the defined types were placed in this encounter.    No chief complaint on file.    History of Present Illness:    Samuel [name] is a 64 y.o. male with history of coronary artery disease and advanced cardiomyopathy which normalized.  He is a very active gentleman now.  He recently saw Dr. Dulce Sellar who evaluated him and brought him in today for an EKG.  Past Medical History:  Diagnosis Date  . Acute MI, anterior wall ([facility]) 02/12/2019  . CAD (coronary artery disease), native coronary artery    a. cath 02/2019 S/p DES to mLAD for 100% stenosis   . Hyperlipidemia LDL goal <70   . Ischemic cardiomyopathy     Past Surgical History:  Procedure Laterality Date  . CARDIAC CATHETERIZATION    . CORONARY/GRAFT ACUTE MI REVASCULARIZATION N/A 02/12/2019   Procedure: Coronary/Graft Acute MI Revascularization;  Surgeon: Marykay Lex, MD;  Location: [facility] INVASIVE CV LAB;  Service: Cardiovascular;  Laterality: N/A;  . HAND SURGERY     right  . LEFT HEART CATH AND CORONARY ANGIOGRAPHY N/A 02/12/2019   Procedure: LEFT HEART CATH AND CORONARY ANGIOGRAPHY;  Surgeon: Marykay Lex, MD;   Location: [facility] INVASIVE CV LAB;  Service: Cardiovascular;  Laterality: N/A;    Current Medications: Current Meds  Medication Sig  . Alirocumab (PRALUENT) 75 MG/ML SOAJ Inject 75 mg into the skin every 14 (fourteen) days.  Marland Kitchen aspirin 81 MG chewable tablet Chew 1 tablet (81 mg total) by mouth daily.  Marland Kitchen atorvastatin (LIPITOR) 80 MG tablet TAKE 1 TABLET BY MOUTH EVERY DAY  . BRILINTA 90 MG TABS tablet TAKE 1 TABLET BY MOUTH 2 TIMES DAILY.  Marland Kitchen Fexofenadine HCl (ALLEGRA PO) Take 1 tablet by mouth daily.   . fluticasone (FLONASE) 50 MCG/ACT nasal spray Place 1 spray into both nostrils daily.  Marland Kitchen ibuprofen (ADVIL) 200 MG tablet Take 200 mg by mouth every 6 (six) hours as needed for mild pain.  . metoprolol tartrate (LOPRESSOR) 25 MG tablet TAKE 1/2 TABLET (12.5 MG TOTAL) BY MOUTH 2 TIMES DAILY.  . nitroGLYCERIN (NITROSTAT) 0.4 MG SL tablet Place 1 tablet (0.4 mg total) under the tongue every 5 (five) minutes as needed.     Allergies:   Sulfa antibiotics   Social History   Socioeconomic History  . Marital status: Married    Spouse name: Not on file  . Number of children: Not on file  . Years of education: Not on file  . Highest education level: Not on file  Occupational History  . Not on file  Tobacco Use  . Smoking status: Never Smoker  . Smokeless tobacco: Never Used  Substance and Sexual Activity  . Alcohol use: No    Alcohol/week: 0.0 standard drinks  . Drug use: No  . Sexual activity: Not on file  Other Topics Concern  . Not on file  Social History Narrative   Married   Patient is very active, he walks usually 2 to 4 miles a day, and bikes 20 to 30 miles 2 times a week.   Non-smoker   Social Determinants of Health   Financial Resource Strain:   . Difficulty of Paying Living Expenses: Not on file  Food Insecurity:   . Worried About Charity fundraiser in the Last Year: Not on file  . Ran Out of Food in the Last Year: Not on file  Transportation Needs:   . Lack of  Transportation (Medical): Not on file  . Lack of Transportation (Non-Medical): Not on file  Physical Activity:   . Days of Exercise per Week: Not on file  . Minutes of Exercise per Session: Not on file  Stress:   . Feeling of Stress : Not on file  Social Connections:   . Frequency of Communication with Friends and Family: Not on file  . Frequency of Social Gatherings with Friends and Family: Not on file  . Attends Religious Services: Not on file  . Active Member of Clubs or Organizations: Not on file  . Attends Archivist Meetings: Not on file  . Marital Status: Not on file     Family History: The patient's family history includes CAD in his paternal uncle; CAD (age of onset: 68) in his father; Diabetes in his sister; Heart Problems in his brother; Heart attack in his mother; Hypertension in his mother and sister; Parkinson's disease in his mother.  ROS:   Please see the history of present illness.    All other systems reviewed and are negative.  EKGs/Labs/Other Studies Reviewed:    The following studies were reviewed today: EKG reveals sinus bradycardia and was otherwise within normal limits.   Recent Labs: 02/12/2019: Hemoglobin 13.2; Platelets 220 03/06/2019: ALT 22; BUN 18; Creatinine, Ser 1.05; Potassium 4.8; Sodium 139  Recent Lipid Panel    Component Value Date/Time   CHOL 132 03/06/2019 0838   TRIG 72 03/06/2019 0838   HDL 48 03/06/2019 0838   CHOLHDL 2.8 03/06/2019 0838   CHOLHDL 2.9 02/12/2019 1231   VLDL 24 02/12/2019 1231   LDLCALC 70 03/06/2019 0838    Physical Exam:    VS:  BP 100/60 (BP Location: Left Arm, Patient Position: Sitting, Cuff Size: Normal)   Pulse (!) 57   Ht 5\' 7"  (1.702 m)   Wt 147 lb (66.7 kg)   SpO2 98%   BMI 23.02 kg/m     Wt Readings from Last 3 Encounters:  07/28/19 147 lb (66.7 kg)  07/21/19 148 lb (67.1 kg)  04/19/19 154 lb (69.9 kg)     GEN: Patient is in no acute distress HEENT: Normal  CARDIAC: Hear sounds  regular, 2/6 systolic murmur at the apex. RESPIRATORY:  Clear to auscultation without rales, wheezing or rhonchi    Signed, Jenean Lindau, MD  07/28/2019 8:39 AM    [place]

## 2019-08-02 LAB — COMPREHENSIVE METABOLIC PANEL
ALT: 25 IU/L (ref 0–44)
AST: 28 IU/L (ref 0–40)
Albumin/Globulin Ratio: 1.7 (ref 1.2–2.2)
Albumin: 4.5 g/dL (ref 3.8–4.8)
Alkaline Phosphatase: 65 IU/L (ref 39–117)
BUN/Creatinine Ratio: 21 (ref 10–24)
BUN: 20 mg/dL (ref 8–27)
Bilirubin Total: 0.6 mg/dL (ref 0.0–1.2)
CO2: 24 mmol/L (ref 20–29)
Calcium: 9.4 mg/dL (ref 8.6–10.2)
Chloride: 103 mmol/L (ref 96–106)
Creatinine, Ser: 0.95 mg/dL (ref 0.76–1.27)
GFR calc Af Amer: 98 mL/min/{1.73_m2} (ref >59)
GFR calc non Af Amer: 85 mL/min/{1.73_m2} (ref >59)
Globulin, Total: 2.7 g/dL (ref 1.5–4.5)
Glucose: 99 mg/dL (ref 65–99)
Potassium: 4.6 mmol/L (ref 3.5–5.2)
Sodium: 139 mmol/L (ref 134–144)
Total Protein: 7.2 g/dL (ref 6.0–8.5)

## 2019-08-02 LAB — LIPID PANEL
Chol/HDL Ratio: 1.5 ratio (ref 0.0–5.0)
Cholesterol, Total: 85 mg/dL — ABNORMAL LOW (ref 100–199)
HDL: 58 mg/dL (ref >39)
LDL Chol Calc (NIH): 13 mg/dL (ref 0–99)
Triglycerides: 58 mg/dL (ref 0–149)
VLDL Cholesterol Cal: 14 mg/dL (ref 5–40)

## 2019-08-02 LAB — LIPOPROTEIN A (LPA): Lipoprotein (a): 266.4 nmol/L — ABNORMAL HIGH (ref <75.0)

## 2019-08-03 ENCOUNTER — Telehealth: Payer: Self-pay

## 2019-08-03 NOTE — Telephone Encounter (Signed)
Pt is returning a call placed to him from [place] RN I will route to her

## 2019-08-03 NOTE — Telephone Encounter (Signed)
Lab results discussed with patient. 

## 2019-09-05 ENCOUNTER — Other Ambulatory Visit: Payer: Self-pay | Admitting: *Deleted

## 2019-09-05 ENCOUNTER — Telehealth: Payer: Self-pay | Admitting: *Deleted

## 2019-09-05 MED ORDER — ATORVASTATIN CALCIUM 80 MG PO TABS: 80.0000 mg | ORAL_TABLET | Freq: Every day | ORAL | 1 refills | Status: DC

## 2019-09-05 MED ORDER — METOPROLOL TARTRATE 25 MG PO TABS: ORAL_TABLET | ORAL | 1 refills | Status: DC

## 2019-09-05 NOTE — Telephone Encounter (Signed)
*  STAT* If patient is at the pharmacy, call can be transferred to refill team.   1. Which medications need to be refilled? (please list name of each medication and dose if known) Metoprolol 25 mg, Take 1/2 daily, #90 and Atorvastatin 80 mg qd, #90  2. Which pharmacy/location (including street and city if local pharmacy) is medication to be sent to?[facility] [place]  3. Do they need a 30 day or 90 day supply? 90

## 2019-09-05 NOTE — Telephone Encounter (Signed)
Refill sent.

## 2019-09-07 ENCOUNTER — Other Ambulatory Visit: Payer: Self-pay | Admitting: Cardiology

## 2019-09-11 ENCOUNTER — Telehealth: Payer: Self-pay | Admitting: *Deleted

## 2019-09-11 MED ORDER — TICAGRELOR 90 MG PO TABS: 90.0000 mg | ORAL_TABLET | Freq: Two times a day (BID) | ORAL | 1 refills | Status: DC

## 2019-09-11 NOTE — Telephone Encounter (Signed)
Rx refill sent to pharmacy. 

## 2019-10-27 ENCOUNTER — Ambulatory Visit: Payer: BC Managed Care – PPO | Admitting: Cardiology

## 2019-11-04 NOTE — Progress Notes (Signed)
Cardiology Office Note:    Date:  11/06/2019   ID:  Samuel [name], DOB [birth year], MRN [id number]  PCP:  Lise Auer, MD  Cardiologist:  Norman Herrlich, MD    Referring MD: Lise Auer, MD    ASSESSMENT:    1. CAD in native artery   2. LV dysfunction   3. Hyperlipidemia with target LDL less than 70   4. Elevated lipoprotein A level    PLAN:    In order of problems listed above:  1. Stable CAD New York Heart Association class I continue dual antiplatelet therapy until September 1 and then drop off Brilinta.  Continue beta-blocker lipid-lowering treatment 2. Resolved post revascularization follow-up echocardiogram 3. Continue combined statin PCSK9 inhibitor recheck labs today including lipid profile and LP(a)   Next appointment: Months   Medication Adjustments/Labs and Tests Ordered: Current medicines are reviewed at length with the patient today.  Concerns regarding medicines are outlined above.  No orders of the defined types were placed in this encounter.  No orders of the defined types were placed in this encounter.   Chief Complaint  Patient presents with  . Follow-up  . Coronary Artery Disease    History of Present Illness:    Samuel [name] is a 64 y.o. male with a hx of anterior ST elevation MI in 02/12/2019 with PCI and drug-eluting stent to left anterior descending coronary artery ejection fraction 45 to 50% with akinesia of the apex and apical septum, inferior and anterior's segments  last seen 07/20/2018.  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  Echocardiogram 03/13/2019 showed normalized left ventricular function EF 60 to 65% and mild aortic regurgitation. Compliance with diet, lifestyle and medications: Yes  He remains vigorous active exercises 6 hours a week in addition to his biking and has had no angina dyspnea palpitation.  At the end of 1 year he wants to just take  isolated aspirin and no drop on his Brilinta as of 03/13/2020.  He tolerates his statin and PCSK9 therapy without muscle weakness. Past Medical History:  Diagnosis Date  . Acute MI, anterior wall (HCC) 02/12/2019  . Acute ST elevation myocardial infarction (STEMI) involving left anterior descending (LAD) coronary artery without development of Q waves (HCC) 02/12/2019  . Acute ST elevation myocardial infarction (STEMI) of anterior wall (HCC) 02/12/2019  . Aortic regurgitation 04/19/2019  . CAD (coronary artery disease), native coronary artery    a. cath 02/2019 S/p DES to mLAD for 100% stenosis   . CAD in native artery 02/20/2019  . Elevated lipoprotein A level 04/19/2019  . Hyperlipidemia LDL goal <70   . Hyperlipidemia with target LDL less than 70 02/12/2019  . Ischemic cardiomyopathy   . LV dysfunction 02/20/2019    Past Surgical History:  Procedure Laterality Date  . CARDIAC CATHETERIZATION    . CORONARY/GRAFT ACUTE MI REVASCULARIZATION N/A 02/12/2019   Procedure: Coronary/Graft Acute MI Revascularization;  Surgeon: Marykay Lex, MD;  Location: [facility] INVASIVE CV LAB;  Service: Cardiovascular;  Laterality: N/A;  . HAND SURGERY     right  . LEFT HEART CATH AND CORONARY ANGIOGRAPHY N/A 02/12/2019   Procedure: LEFT HEART CATH AND CORONARY ANGIOGRAPHY;  Surgeon: Marykay Lex, MD;  Location: [facility] INVASIVE CV LAB;  Service: Cardiovascular;  Laterality: N/A;    Current Medications: Current Meds  Medication Sig  . Alirocumab (PRALUENT) 75 MG/ML SOAJ Inject 75  mg into the skin every 14 (fourteen) days.  Marland Kitchen aspirin 81 MG chewable tablet Chew 1 tablet (81 mg total) by mouth daily.  Marland Kitchen atorvastatin (LIPITOR) 80 MG tablet Take 1 tablet (80 mg total) by mouth daily.  Marland Kitchen Fexofenadine HCl (ALLEGRA PO) Take 1 tablet by mouth daily.   . fluticasone (FLONASE) 50 MCG/ACT nasal spray Place 1 spray into both nostrils daily.  Marland Kitchen ibuprofen (ADVIL) 200 MG tablet Take 200 mg by mouth every 6 (six) hours as needed for  mild pain.  . metoprolol tartrate (LOPRESSOR) 25 MG tablet TAKE 1/2 TABLET (12.5 MG TOTAL) BY MOUTH 2 TIMES DAILY.  . nitroGLYCERIN (NITROSTAT) 0.4 MG SL tablet Place 1 tablet (0.4 mg total) under the tongue every 5 (five) minutes as needed.  . ticagrelor (BRILINTA) 90 MG TABS tablet Take 1 tablet (90 mg total) by mouth 2 (two) times daily.     Allergies:   Sulfa antibiotics   Social History   Socioeconomic History  . Marital status: Married    Spouse name: Not on file  . Number of children: Not on file  . Years of education: Not on file  . Highest education level: Not on file  Occupational History  . Not on file  Tobacco Use  . Smoking status: Never Smoker  . Smokeless tobacco: Never Used  Substance and Sexual Activity  . Alcohol use: No    Alcohol/week: 0.0 standard drinks  . Drug use: No  . Sexual activity: Not on file  Other Topics Concern  . Not on file  Social History Narrative   Married   Patient is very active, he walks usually 2 to 4 miles a day, and bikes 20 to 30 miles 2 times a week.   Non-smoker   Social Determinants of Health   Financial Resource Strain:   . Difficulty of Paying Living Expenses:   Food Insecurity:   . Worried About Charity fundraiser in the Last Year:   . Arboriculturist in the Last Year:   Transportation Needs:   . Film/video editor (Medical):   Marland Kitchen Lack of Transportation (Non-Medical):   Physical Activity:   . Days of Exercise per Week:   . Minutes of Exercise per Session:   Stress:   . Feeling of Stress :   Social Connections:   . Frequency of Communication with Friends and Family:   . Frequency of Social Gatherings with Friends and Family:   . Attends Religious Services:   . Active Member of Clubs or Organizations:   . Attends Archivist Meetings:   Marland Kitchen Marital Status:      Family History: The patient's family history includes CAD in his paternal uncle; CAD (age of onset: 74) in his father; Diabetes in his  sister; Heart Problems in his brother; Heart attack in his mother; Hypertension in his mother and sister; Parkinson's disease in his mother. ROS:   Please see the history of present illness.    All other systems reviewed and are negative.  EKGs/Labs/Other Studies Reviewed:    The following studies were reviewed today:   Recent Labs: 02/12/2019: Hemoglobin 13.2; Platelets 220 07/28/2019: ALT 25; BUN 20; Creatinine, Ser 0.95; Potassium 4.6; Sodium 139  Recent Lipid Panel    Component Value Date/Time   CHOL 85 (L) 07/28/2019 0833   TRIG 58 07/28/2019 0833   HDL 58 07/28/2019 0833   CHOLHDL 1.5 07/28/2019 0833   CHOLHDL 2.9 02/12/2019 1231   VLDL 24  02/12/2019 1231   LDLCALC 13 07/28/2019 0833    Physical Exam:    VS:  BP 110/66   Pulse 61   Temp (!) 96.8 F (36 C)   Ht 5\' 7"  (1.702 m)   Wt 146 lb 12.8 oz (66.6 kg)   SpO2 97%   BMI 22.99 kg/m     Wt Readings from Last 3 Encounters:  11/06/19 146 lb 12.8 oz (66.6 kg)  07/28/19 147 lb (66.7 kg)  07/21/19 148 lb (67.1 kg)     GEN:  Well nourished, well developed in no acute distress HEENT: Normal NECK: No JVD; No carotid bruits LYMPHATICS: No lymphadenopathy CARDIAC: \RRR, no murmurs, rubs, gallops RESPIRATORY:  Clear to auscultation without rales, wheezing or rhonchi  ABDOMEN: Soft, non-tender, non-distended MUSCULOSKELETAL:  No edema; No deformity  SKIN: Warm and dry NEUROLOGIC:  Alert and oriented x 3 PSYCHIATRIC:  Normal affect    Signed, 09/18/19, MD  11/06/2019 8:44 AM    [facility]

## 2019-11-06 ENCOUNTER — Other Ambulatory Visit: Payer: Self-pay

## 2019-11-06 ENCOUNTER — Encounter: Payer: Self-pay | Admitting: Cardiology

## 2019-11-06 ENCOUNTER — Ambulatory Visit: Payer: BC Managed Care – PPO | Admitting: Cardiology

## 2019-11-06 VITALS — BP 110/66 | HR 61 | Temp 96.8°F | Ht 67.0 in | Wt 146.8 lb

## 2019-11-06 DIAGNOSIS — I519 Heart disease, unspecified: Secondary | ICD-10-CM | POA: Diagnosis not present

## 2019-11-06 DIAGNOSIS — I2510 Atherosclerotic heart disease of native coronary artery without angina pectoris: Secondary | ICD-10-CM

## 2019-11-06 DIAGNOSIS — E7841 Elevated Lipoprotein(a): Secondary | ICD-10-CM | POA: Diagnosis not present

## 2019-11-06 DIAGNOSIS — E785 Hyperlipidemia, unspecified: Secondary | ICD-10-CM

## 2019-11-06 NOTE — Patient Instructions (Addendum)
Medication Instructions:  Your physician recommends that you continue on your current medications as directed. Please refer to the Current Medication list given to you today.  *If you need a refill on your cardiac medications before your next appointment, please call your pharmacy*   Lab Work: Your physician recommends that you return for lab work in: TODAY CMP, Lipids, LPA If you have labs (blood work) drawn today and your tests are completely normal, you will receive your results only by: Marland Kitchen MyChart Message (if you have MyChart) OR . A paper copy in the mail If you have any lab test that is abnormal or we need to change your treatment, we will call you to review the results.   Testing/Procedures: None   Follow-Up: At [facility], you and your health needs are our priority.  As part of our continuing mission to provide you with exceptional heart care, we have created designated Provider Care Teams.  These Care Teams include your primary Cardiologist (physician) and Advanced Practice Providers (APPs -  Physician Assistants and Nurse Practitioners) who all work together to provide you with the care you need, when you need it.  We recommend signing up for the patient portal called "MyChart".  Sign up information is provided on this After Visit Summary.  MyChart is used to connect with patients for Virtual Visits (Telemedicine).  Patients are able to view lab/test results, encounter notes, upcoming appointments, etc.  Non-urgent messages can be sent to your provider as well.   To learn more about what you can do with MyChart, go to [email or web address].    Your next appointment:   6 month(s)  The format for your next appointment:   In Person  Provider:   Norman Herrlich, MD   Other Instructions Please discontinue your Brillinta on 03/13/2020.

## 2019-11-07 ENCOUNTER — Telehealth: Payer: Self-pay

## 2019-11-07 LAB — COMPREHENSIVE METABOLIC PANEL
ALT: 27 IU/L (ref 0–44)
AST: 27 IU/L (ref 0–40)
Albumin/Globulin Ratio: 1.7 (ref 1.2–2.2)
Albumin: 4.2 g/dL (ref 3.8–4.8)
Alkaline Phosphatase: 69 IU/L (ref 39–117)
BUN/Creatinine Ratio: 21 (ref 10–24)
BUN: 20 mg/dL (ref 8–27)
Bilirubin Total: 0.5 mg/dL (ref 0.0–1.2)
CO2: 24 mmol/L (ref 20–29)
Calcium: 9.6 mg/dL (ref 8.6–10.2)
Chloride: 103 mmol/L (ref 96–106)
Creatinine, Ser: 0.95 mg/dL (ref 0.76–1.27)
GFR calc Af Amer: 98 mL/min/{1.73_m2} (ref >59)
GFR calc non Af Amer: 85 mL/min/{1.73_m2} (ref >59)
Globulin, Total: 2.5 g/dL (ref 1.5–4.5)
Glucose: 105 mg/dL — ABNORMAL HIGH (ref 65–99)
Potassium: 4.8 mmol/L (ref 3.5–5.2)
Sodium: 139 mmol/L (ref 134–144)
Total Protein: 6.7 g/dL (ref 6.0–8.5)

## 2019-11-07 LAB — LIPOPROTEIN A (LPA): Lipoprotein (a): 210.6 nmol/L — ABNORMAL HIGH (ref <75.0)

## 2019-11-07 LAB — LIPID PANEL
Chol/HDL Ratio: 1.4 ratio (ref 0.0–5.0)
Cholesterol, Total: 83 mg/dL — ABNORMAL LOW (ref 100–199)
HDL: 59 mg/dL (ref >39)
LDL Chol Calc (NIH): 12 mg/dL (ref 0–99)
Triglycerides: 41 mg/dL (ref 0–149)
VLDL Cholesterol Cal: 12 mg/dL (ref 5–40)

## 2019-11-07 NOTE — Telephone Encounter (Signed)
-----   Message from Baldo Daub, MD sent at 11/07/2019  7:45 AM EDT ----- Randie Heinz result, please ask him to reduce his Lipitor to just 2 days/week

## 2019-11-07 NOTE — Telephone Encounter (Signed)
Left message on patients voicemail to please return our call.   

## 2019-11-07 NOTE — Telephone Encounter (Signed)
Spoke with patient regarding results and recommendation.  Patient verbalizes understanding and is agreeable to plan of care. Advised patient to call back with any issues or concerns.  

## 2019-12-01 ENCOUNTER — Other Ambulatory Visit: Payer: Self-pay | Admitting: *Deleted

## 2019-12-01 MED ORDER — PRALUENT 75 MG/ML ~~LOC~~ SOAJ: 75.0000 mg | SUBCUTANEOUS | 11 refills | Status: DC

## 2019-12-04 ENCOUNTER — Telehealth: Payer: Self-pay | Admitting: Cardiology

## 2019-12-04 MED ORDER — PRALUENT 75 MG/ML ~~LOC~~ SOAJ: 75.0000 mg | SUBCUTANEOUS | 11 refills | Status: DC

## 2019-12-04 NOTE — Telephone Encounter (Signed)
New Message   Pt c/o medication issue:  1. Name of Medication:Alirocumab (PRALUENT) 75 MG/ML SOAJ    2. How are you currently taking this medication (dosage and times per day)? Inject 75 mg into the skin every 14 (fourteen) days.  3. Are you having a reaction (difficulty breathing--STAT)?   4. What is your medication issue? Patient states that he spoke with [facility] and they are no longer able to get the medication anymore. He is wondering is there something else that he can take. He picked up the last one on 5/21

## 2019-12-04 NOTE — Telephone Encounter (Signed)
Spoke to patient just now and he let me know that he could use the [facility] in [place]. This prescription was sent in there for him.    Encouraged patient to call back with any questions or concerns.

## 2019-12-04 NOTE — Telephone Encounter (Signed)
Lets send to another pharmacy I do not think there is a [facility] exercise [facility] at this time

## 2019-12-05 NOTE — Telephone Encounter (Signed)
Thanks for follow up.  Just to clarify. Praluent is NOT on Sport [facility] exercise psychologist. NDC changes and some small pharmacies are not able to order new NDC.   Will follow up with patient is any problems.

## 2020-03-01 ENCOUNTER — Other Ambulatory Visit: Payer: Self-pay | Admitting: Cardiology

## 2020-04-23 DIAGNOSIS — I2510 Atherosclerotic heart disease of native coronary artery without angina pectoris: Secondary | ICD-10-CM | POA: Insufficient documentation

## 2020-04-23 DIAGNOSIS — I255 Ischemic cardiomyopathy: Secondary | ICD-10-CM | POA: Insufficient documentation

## 2020-04-23 DIAGNOSIS — E785 Hyperlipidemia, unspecified: Secondary | ICD-10-CM | POA: Insufficient documentation

## 2020-04-24 NOTE — Progress Notes (Signed)
Cardiology Office Note:    Date:  04/25/2020   ID:  Samuel [name], DOB 01-31-1956, MRN [id number]  PCP:  Lise Auer, MD  Cardiologist:  Norman Herrlich, MD    Referring MD: Lise Auer, MD    ASSESSMENT:    1. CAD in native artery   2. Hyperlipidemia with target LDL less than 70   3. Elevated lipoprotein A level   4. Aortic valve insufficiency, etiology of cardiac valve disease unspecified    PLAN:    In order of problems listed above:  1. Stable New York Heart Association class I having no angina at this time after surgical revascularization medical therapy including aspirin beta-blocker and lipid-lowering.  Plan EKG next visit.  He has a good lifestyle [facility] and follows cardiac diet 2. Stable on combined statin high intensity 2 days a week and PCSK9 inhibitor Praluent recheck liver function lipid profile LP(a) level and weight specific novel treatment for LP(a) excess 3. Stable mild at this time does not need an echocardiogram   Next appointment: 6 months   Medication Adjustments/Labs and Tests Ordered: Current medicines are reviewed at length with the patient today.  Concerns regarding medicines are outlined above.  No orders of the defined types were placed in this encounter.  No orders of the defined types were placed in this encounter.   Chief Complaint  Patient presents with  . Follow-up  . Coronary Artery Disease    History of Present Illness:    Samuel [name] is a 64 y.o. male with a hx of CAD anterior STEMI 02/12/2019 with PCI and drug-eluting stent left anterior descending coronary artery EF 45%.  Subsequent echocardiogram 03/13/2019 showed normalization of left ventricular function EF 60 to 65%.  Last seen 11/06/2019. Compliance with diet, lifestyle and medications: Yes  From a cardiology perspective is done well vigorous active no angina dyspnea palpitation or syncope.  His predominant problem is vertigo he has been in physical therapy  needs have maneuvers performed and been transiently helpful asked me if is related to his cardiac medications and I told him I do not think there is any connection but I did ask him to follow-up with ENT for further evaluation.  He tolerates combined lipid-lowering therapy with PCSK9 inhibitor and reduced dose atorvastatin with an LDL of less than 25.  No muscle pain or weakness.  He had an EKG performed July 14, 2000 1  His lipoprotein a is severely elevated  Ref Range & Units 5 mo ago 9 mo ago 1 yr ago  Lipoprotein (a) <75.0 nmol/L 210.6High  266.4High CM  294.9High CM     Past Medical History:  Diagnosis Date  . Acute MI, anterior wall (HCC) 02/12/2019  . Acute ST elevation myocardial infarction (STEMI) involving left anterior descending (LAD) coronary artery without development of Q waves (HCC) 02/12/2019  . Acute ST elevation myocardial infarction (STEMI) of anterior wall (HCC) 02/12/2019  . Aortic regurgitation 04/19/2019  . CAD (coronary artery disease), native coronary artery    a. cath 02/2019 S/p DES to mLAD for 100% stenosis   . CAD in native artery 02/20/2019  . Elevated lipoprotein A level 04/19/2019  . Hyperlipidemia LDL goal <70   . Hyperlipidemia with target LDL less than 70 02/12/2019  . Ischemic cardiomyopathy   . LV dysfunction 02/20/2019    Past Surgical History:  Procedure Laterality Date  . CARDIAC CATHETERIZATION    . CORONARY/GRAFT ACUTE MI REVASCULARIZATION N/A  02/12/2019   Procedure: Coronary/Graft Acute MI Revascularization;  Surgeon: Marykay Lex, MD;  Location: [facility] INVASIVE CV LAB;  Service: Cardiovascular;  Laterality: N/A;  . HAND SURGERY     right  . LEFT HEART CATH AND CORONARY ANGIOGRAPHY N/A 02/12/2019   Procedure: LEFT HEART CATH AND CORONARY ANGIOGRAPHY;  Surgeon: Marykay Lex, MD;  Location: [facility] INVASIVE CV LAB;  Service: Cardiovascular;  Laterality: N/A;    Current Medications: Current Meds  Medication Sig  . Alirocumab (PRALUENT) 75 MG/ML  SOAJ Inject 75 mg into the skin every 14 (fourteen) days.  Marland Kitchen aspirin 81 MG chewable tablet Chew 1 tablet (81 mg total) by mouth daily.  Marland Kitchen atorvastatin (LIPITOR) 80 MG tablet TAKE 1 TABLET BY MOUTH EVERY DAY  . Ferrous Sulfate (IRON) 325 (65 Fe) MG TABS Take 1 tablet by mouth daily.  Marland Kitchen Fexofenadine HCl (ALLEGRA PO) Take 1 tablet by mouth daily.   . Flaxseed, Linseed, (FLAXSEED OIL PO) Take 1 capsule by mouth daily.  . fluticasone (FLONASE) 50 MCG/ACT nasal spray Place 1 spray into both nostrils daily.  Marland Kitchen ibuprofen (ADVIL) 200 MG tablet Take 200 mg by mouth every 6 (six) hours as needed for mild pain.  . metoprolol tartrate (LOPRESSOR) 25 MG tablet TAKE 1/2 TABLET (12.5 MG TOTAL) BY MOUTH 2 TIMES DAILY.  Marland Kitchen Misc Natural Products (JOINT SUPPORT COMPLEX PO) Take 1 tablet by mouth daily.  . Multiple Vitamins-Minerals (MENS 50+ MULTI VITAMIN/MIN PO) Take 1 tablet by mouth daily.  . nitroGLYCERIN (NITROSTAT) 0.4 MG SL tablet Place 1 tablet (0.4 mg total) under the tongue every 5 (five) minutes as needed.  . Omega-3 Fatty Acids (OMEGA-3 FISH OIL PO) Take 1 capsule by mouth daily.     Allergies:   Sulfa antibiotics   Social History   Socioeconomic History  . Marital status: Married    Spouse name: Not on file  . Number of children: Not on file  . Years of education: Not on file  . Highest education level: Not on file  Occupational History  . Not on file  Tobacco Use  . Smoking status: Never Smoker  . Smokeless tobacco: Never Used  Vaping Use  . Vaping Use: Never used  Substance and Sexual Activity  . Alcohol use: No    Alcohol/week: 0.0 standard drinks  . Drug use: No  . Sexual activity: Not on file  Other Topics Concern  . Not on file  Social History Narrative   Married   Patient is very active, he walks usually 2 to 4 miles a day, and bikes 20 to 30 miles 2 times a week.   Non-smoker   Social Determinants of Health   Financial Resource Strain:   . Difficulty of Paying Living  Expenses: Not on file  Food Insecurity:   . Worried About Programme researcher, broadcasting/film/video in the Last Year: Not on file  . Ran Out of Food in the Last Year: Not on file  Transportation Needs:   . Lack of Transportation (Medical): Not on file  . Lack of Transportation (Non-Medical): Not on file  Physical Activity:   . Days of Exercise per Week: Not on file  . Minutes of Exercise per Session: Not on file  Stress:   . Feeling of Stress : Not on file  Social Connections:   . Frequency of Communication with Friends and Family: Not on file  . Frequency of Social Gatherings with Friends and Family: Not on file  . Attends Religious  Services: Not on file  . Active Member of Clubs or Organizations: Not on file  . Attends Banker Meetings: Not on file  . Marital Status: Not on file     Family History: The patient's family history includes CAD in his paternal uncle; CAD (age of onset: 38) in his father; Diabetes in his sister; Heart Problems in his brother; Heart attack in his mother; Hypertension in his mother and sister; Parkinson's disease in his mother. ROS:   Please see the history of present illness.    All other systems reviewed and are negative.  EKGs/Labs/Other Studies Reviewed:    The following studies were reviewed today:    Recent Labs: 11/06/2019: ALT 27; BUN 20; Creatinine, Ser 0.95; Potassium 4.8; Sodium 139  Recent Lipid Panel    Component Value Date/Time   CHOL 83 (L) 11/06/2019 0902   TRIG 41 11/06/2019 0902   HDL 59 11/06/2019 0902   CHOLHDL 1.4 11/06/2019 0902   CHOLHDL 2.9 02/12/2019 1231   VLDL 24 02/12/2019 1231   LDLCALC 12 11/06/2019 0902    Physical Exam:    VS:  BP 124/69   Pulse 60   Ht 5\' 7"  (1.702 m)   Wt 142 lb 3.2 oz (64.5 kg)   SpO2 97%   BMI 22.27 kg/m     Wt Readings from Last 3 Encounters:  04/25/20 142 lb 3.2 oz (64.5 kg)  11/06/19 146 lb 12.8 oz (66.6 kg)  07/28/19 147 lb (66.7 kg)     GEN:  Well nourished, well developed in no  acute distress HEENT: Normal NECK: No JVD; No carotid bruits LYMPHATICS: No lymphadenopathy CARDIAC: RRR, no murmurs, rubs, gallops RESPIRATORY:  Clear to auscultation without rales, wheezing or rhonchi  ABDOMEN: Soft, non-tender, non-distended MUSCULOSKELETAL:  No edema; No deformity  SKIN: Warm and dry NEUROLOGIC:  Alert and oriented x 3 PSYCHIATRIC:  Normal affect    Signed, 07/30/19, MD  04/25/2020 8:45 AM    [facility]

## 2020-04-25 ENCOUNTER — Encounter: Payer: Self-pay | Admitting: Cardiology

## 2020-04-25 ENCOUNTER — Other Ambulatory Visit: Payer: Self-pay

## 2020-04-25 ENCOUNTER — Ambulatory Visit: Payer: BC Managed Care – PPO | Admitting: Cardiology

## 2020-04-25 VITALS — BP 124/69 | HR 60 | Ht 67.0 in | Wt 142.2 lb

## 2020-04-25 DIAGNOSIS — I2510 Atherosclerotic heart disease of native coronary artery without angina pectoris: Secondary | ICD-10-CM | POA: Diagnosis not present

## 2020-04-25 DIAGNOSIS — I351 Nonrheumatic aortic (valve) insufficiency: Secondary | ICD-10-CM

## 2020-04-25 DIAGNOSIS — E7841 Elevated Lipoprotein(a): Secondary | ICD-10-CM

## 2020-04-25 DIAGNOSIS — E785 Hyperlipidemia, unspecified: Secondary | ICD-10-CM | POA: Diagnosis not present

## 2020-04-25 MED ORDER — ATORVASTATIN CALCIUM 80 MG PO TABS
80.0000 mg | ORAL_TABLET | ORAL | 1 refills | Status: DC
Start: 2020-04-25 — End: 2021-04-21

## 2020-04-25 NOTE — Patient Instructions (Signed)
Medication Instructions:  Your physician recommends that you continue on your current medications as directed. Please refer to the Current Medication list given to you today.  *If you need a refill on your cardiac medications before your next appointment, please call your pharmacy*   Lab Work: Your physician recommends that you return for lab work in: TODAY CMP, Lipids, LPA If you have labs (blood work) drawn today and your tests are completely normal, you will receive your results only by: . MyChart Message (if you have MyChart) OR . A paper copy in the mail If you have any lab test that is abnormal or we need to change your treatment, we will call you to review the results.   Testing/Procedures: None   Follow-Up: At [facility], you and your health needs are our priority.  As part of our continuing mission to provide you with exceptional heart care, we have created designated Provider Care Teams.  These Care Teams include your primary Cardiologist (physician) and Advanced Practice Providers (APPs -  Physician Assistants and Nurse Practitioners) who all work together to provide you with the care you need, when you need it.  We recommend signing up for the patient portal called "MyChart".  Sign up information is provided on this After Visit Summary.  MyChart is used to connect with patients for Virtual Visits (Telemedicine).  Patients are able to view lab/test results, encounter notes, upcoming appointments, etc.  Non-urgent messages can be sent to your provider as well.   To learn more about what you can do with MyChart, go to [email or web address].    Your next appointment:   6 month(s)  The format for your next appointment:   In Person  Provider:   Brian Munley, MD   Other Instructions   

## 2020-04-26 LAB — COMPREHENSIVE METABOLIC PANEL
ALT: 19 IU/L (ref 0–44)
AST: 20 IU/L (ref 0–40)
Albumin/Globulin Ratio: 1.7 (ref 1.2–2.2)
Albumin: 4.4 g/dL (ref 3.8–4.8)
Alkaline Phosphatase: 60 IU/L (ref 44–121)
BUN/Creatinine Ratio: 18 (ref 10–24)
BUN: 16 mg/dL (ref 8–27)
Bilirubin Total: 0.6 mg/dL (ref 0.0–1.2)
CO2: 25 mmol/L (ref 20–29)
Calcium: 9.6 mg/dL (ref 8.6–10.2)
Chloride: 102 mmol/L (ref 96–106)
Creatinine, Ser: 0.89 mg/dL (ref 0.76–1.27)
GFR calc Af Amer: 104 mL/min/{1.73_m2} (ref >59)
GFR calc non Af Amer: 90 mL/min/{1.73_m2} (ref >59)
Globulin, Total: 2.6 g/dL (ref 1.5–4.5)
Glucose: 101 mg/dL — ABNORMAL HIGH (ref 65–99)
Potassium: 4.6 mmol/L (ref 3.5–5.2)
Sodium: 137 mmol/L (ref 134–144)
Total Protein: 7 g/dL (ref 6.0–8.5)

## 2020-04-26 LAB — LIPID PANEL
Chol/HDL Ratio: 1.3 ratio (ref 0.0–5.0)
Cholesterol, Total: 99 mg/dL — ABNORMAL LOW (ref 100–199)
HDL: 74 mg/dL (ref >39)
LDL Chol Calc (NIH): 11 mg/dL (ref 0–99)
Triglycerides: 64 mg/dL (ref 0–149)
VLDL Cholesterol Cal: 14 mg/dL (ref 5–40)

## 2020-04-26 LAB — LIPOPROTEIN A (LPA): Lipoprotein (a): 234.9 nmol/L — ABNORMAL HIGH (ref <75.0)

## 2020-04-29 ENCOUNTER — Telehealth: Payer: Self-pay | Admitting: Cardiology

## 2020-04-29 NOTE — Telephone Encounter (Signed)
Patient is returning call to discuss results from lab work completed on 04/25/20.  °

## 2020-04-29 NOTE — Telephone Encounter (Signed)
Pt verbalized understanding of his lab results and will call back if he has any further questions.

## 2020-08-27 ENCOUNTER — Other Ambulatory Visit: Payer: Self-pay | Admitting: Cardiology

## 2020-08-27 NOTE — Telephone Encounter (Signed)
Refill sent to pharmacy.   

## 2020-09-11 ENCOUNTER — Telehealth: Payer: Self-pay

## 2020-09-11 NOTE — Telephone Encounter (Signed)
   Primary Cardiologist: Dr. Norman Herrlich, MD  Chart reviewed as part of pre-operative protocol coverage. Cataract extractions are recognized in guidelines as low risk surgeries that do not typically require specific preoperative testing or holding of blood thinner therapy. Therefore, given past medical history and time since last visit, based on ACC/AHA guidelines, Samuel [name] would be at acceptable risk for the planned procedure without further cardiovascular testing.   If the procedure is expected to be more complex, please re-send and our team will review once again.   I will route this recommendation to the requesting party via Epic fax function and remove from pre-op pool.  Please call with questions.  Georgie Chard, NP 09/11/2020, 5:12 PM

## 2020-09-11 NOTE — Telephone Encounter (Signed)
   [facility] Pre-operative Risk Assessment    HEARTCARE STAFF: - Please ensure there is not already an duplicate clearance open for this procedure. - Under Visit Info/Reason for Call, type in Other and utilize the format Clearance MM/DD/YY or Clearance TBD. Do not use dashes or single digits. - If request is for dental extraction, please clarify the # of teeth to be extracted.  Request for surgical clearance:  1. What type of surgery is being performed? Cataract extraction with intraocular lens implantation of right eye followed by left eye   2. When is this surgery scheduled? 10/15/20 and 10/31/20   3. What type of clearance is required (medical clearance vs. Pharmacy clearance to hold med vs. Both)? Medical  4. Are there any medications that need to be held prior to surgery and how long?   5. Practice name and name of physician performing surgery? [facility]- Dr. Rolanda Jay   6. What is the office phone number? [phone]   7.   What is the office fax number? [phone]  8.   Anesthesia type (None, local, MAC, general) ? Topical with IV medication   Lowella Grip 09/11/2020, 4:55 PM  _________________________________________________________________   (provider comments below)

## 2020-09-21 ENCOUNTER — Other Ambulatory Visit: Payer: Self-pay | Admitting: Cardiology

## 2020-09-23 NOTE — Telephone Encounter (Signed)
Atorvastatin approved and sent 

## 2020-10-11 HISTORY — PX: CATARACT EXTRACTION, BILATERAL: SHX1313

## 2020-10-30 ENCOUNTER — Ambulatory Visit: Payer: BC Managed Care – PPO | Admitting: Cardiology

## 2020-11-18 NOTE — Progress Notes (Signed)
Cardiology Office Note:    Date:  11/19/2020   ID:  [name], DOB 1955-12-31, MRN [id number]  PCP:  Lise Auer, MD  Cardiologist:  Norman Herrlich, MD    Referring MD: Lise Auer, MD    ASSESSMENT:    1. CAD in native artery   2. Hyperlipidemia with target LDL less than 70   3. Elevated lipoprotein A level   4. Aortic valve insufficiency, etiology of cardiac valve disease unspecified    PLAN:    In order of problems listed above:  1. He continues to do well New York Heart Association class I no angina on current medical therapy continue the same at this time I would not pursue an ischemic evaluation we will continue beta-blocker aspirin and combined lipid-lowering therapy with high intensity statin and PCSK9 inhibitor pending lipid clinic evaluation regarding Inclisirin 2. Interestingly he has mild aortic regurgitation but not stenosis which is associated with LP(a) and rapid progression   Next appointment: 6 months we will recheck a fasting lipid profile LP(a) level today   Medication Adjustments/Labs and Tests Ordered: Current medicines are reviewed at length with the patient today.  Concerns regarding medicines are outlined above.  No orders of the defined types were placed in this encounter.  No orders of the defined types were placed in this encounter.   Chief Complaint  Patient presents with  . Follow-up  . Coronary Artery Disease  . Hyperlipidemia    History of Present Illness:    Samuel [name] is a 65 y.o. male with a hx of CAD with anterior ST elevation MI 08//22/2020 with PCI and drug-eluting stent to the LAD EF 45% with subsequent normalization on follow-up echocardiogram, hyperlipidemia with poor statin intolerance elevated lipoprotein a level and mild aortic regurgitation.  He was last seen 04/25/2020.  Compliance with diet, lifestyle and medications: Yes  He has good healthcare literacy I discussed his lipid disorder with him he had done well on the  combination of PCSK9 inhibitor and his high intensity statin is interested in novel therapy for LP(a) and I will direct him to the lipid clinic. He has no muscle pain or weakness from his lipid-lowering therapy He continues to do well and has had no angina shortness of breath chest pain palpitation or syncope and is preparing to retire and transition to Medicare advantage  LP(a) level remains severely elevated. Component Ref Range & Units 6 mo ago  (04/25/20) 1 yr ago  (11/06/19) 1 yr ago  (07/28/19) 1 yr ago  (03/06/19)  Lipoprotein (a) <75.0 nmol/L 234.9High  210.6High CM  266.4High CM  294.9High   Profile shows an LDL cholesterol in the lowest quartile at 11 on last determination. Past Medical History:  Diagnosis Date  . Acute MI, anterior wall (HCC) 02/12/2019  . Acute ST elevation myocardial infarction (STEMI) involving left anterior descending (LAD) coronary artery without development of Q waves (HCC) 02/12/2019  . Acute ST elevation myocardial infarction (STEMI) of anterior wall (HCC) 02/12/2019  . Aortic regurgitation 04/19/2019  . CAD (coronary artery disease), native coronary artery    a. cath 02/2019 S/p DES to mLAD for 100% stenosis   . CAD in native artery 02/20/2019  . Elevated lipoprotein A level 04/19/2019  . Glaucoma   . Hyperlipidemia LDL goal <70   . Hyperlipidemia with target LDL less than 70 02/12/2019  . Ischemic cardiomyopathy   . LV dysfunction 02/20/2019    Past Surgical History:  Procedure Laterality Date  .  CARDIAC CATHETERIZATION    . CATARACT EXTRACTION, BILATERAL Bilateral 10/2020  . CORONARY/GRAFT ACUTE MI REVASCULARIZATION N/A 02/12/2019   Procedure: Coronary/Graft Acute MI Revascularization;  Surgeon: Marykay Lex, MD;  Location: [facility] INVASIVE CV LAB;  Service: Cardiovascular;  Laterality: N/A;  . HAND SURGERY     right  . LEFT HEART CATH AND CORONARY ANGIOGRAPHY N/A 02/12/2019   Procedure: LEFT HEART CATH AND CORONARY ANGIOGRAPHY;  Surgeon: Marykay Lex, MD;  Location: [facility] INVASIVE CV LAB;  Service: Cardiovascular;  Laterality: N/A;    Current Medications: Current Meds  Medication Sig  . Alirocumab (PRALUENT) 75 MG/ML SOAJ Inject 75 mg into the skin every 14 (fourteen) days.  Marland Kitchen aspirin 81 MG chewable tablet Chew 1 tablet (81 mg total) by mouth daily.  Marland Kitchen atorvastatin (LIPITOR) 80 MG tablet Take 1 tablet (80 mg total) by mouth 2 (two) times a week.  . brimonidine (ALPHAGAN) 0.2 % ophthalmic solution Place 1 drop into both eyes 2 (two) times daily.  . DUREZOL 0.05 % EMUL Place 1 drop into the left eye 3 (three) times daily.  . Ferrous Sulfate (IRON) 325 (65 Fe) MG TABS Take 1 tablet by mouth daily.  Marland Kitchen Fexofenadine HCl (ALLEGRA PO) Take 1 tablet by mouth daily.   . Flaxseed, Linseed, (FLAXSEED OIL PO) Take 1 capsule by mouth daily.  . fluticasone (FLONASE) 50 MCG/ACT nasal spray Place 1 spray into both nostrils daily.  Marland Kitchen ibuprofen (ADVIL) 200 MG tablet Take 200 mg by mouth every 6 (six) hours as needed for mild pain.  . metoprolol tartrate (LOPRESSOR) 25 MG tablet Take 12.5 mg by mouth 2 (two) times daily.  . Misc Natural Products (JOINT SUPPORT COMPLEX PO) Take 1 tablet by mouth daily.  . Multiple Vitamins-Minerals (MENS 50+ MULTI VITAMIN/MIN PO) Take 1 tablet by mouth daily.  . nitroGLYCERIN (NITROSTAT) 0.4 MG SL tablet PLACE 1 TABLET (0.4 MG TOTAL) UNDER THE TONGUE EVERY 5 (FIVE) MINUTES AS NEEDED.  Marland Kitchen Omega-3 Fatty Acids (OMEGA-3 FISH OIL PO) Take 1 capsule by mouth daily.  Marland Kitchen PROLENSA 0.07 % SOLN Place 1 drop into the left eye at bedtime.     Allergies:   Sulfa antibiotics   Social History   Socioeconomic History  . Marital status: Married    Spouse name: Not on file  . Number of children: Not on file  . Years of education: Not on file  . Highest education level: Not on file  Occupational History  . Not on file  Tobacco Use  . Smoking status: Never Smoker  . Smokeless tobacco: Never Used  Vaping Use  . Vaping Use: Never  used  Substance and Sexual Activity  . Alcohol use: No    Alcohol/week: 0.0 standard drinks  . Drug use: No  . Sexual activity: Not on file  Other Topics Concern  . Not on file  Social History Narrative   Married   Patient is very active, he walks usually 2 to 4 miles a day, and bikes 20 to 30 miles 2 times a week.   Non-smoker   Social Determinants of Corporate investment banker Strain: Not on file  Food Insecurity: Not on file  Transportation Needs: Not on file  Physical Activity: Not on file  Stress: Not on file  Social Connections: Not on file     Family History: The patient's family history includes CAD in his paternal uncle; CAD (age of onset: 17) in his father; Diabetes in his sister; Heart Problems  in his brother; Heart attack in his mother; Hypertension in his mother and sister; Parkinson's disease in his mother. ROS:   Please see the history of present illness.    All other systems reviewed and are negative.  EKGs/Labs/Other Studies Reviewed:    The following studies were reviewed today:  EKG:  EKG ordered today and personally reviewed.  The ekg ordered today demonstrates sinus rhythm right bundle branch block  Recent Labs: 04/25/2020: ALT 19; BUN 16; Creatinine, Ser 0.89; Potassium 4.6; Sodium 137  Recent Lipid Panel    Component Value Date/Time   CHOL 99 (L) 04/25/2020 0851   TRIG 64 04/25/2020 0851   HDL 74 04/25/2020 0851   CHOLHDL 1.3 04/25/2020 0851   CHOLHDL 2.9 02/12/2019 1231   VLDL 24 02/12/2019 1231   LDLCALC 11 04/25/2020 0851    Physical Exam:    VS:  BP 100/66 (BP Location: Right Arm, Patient Position: Sitting, Cuff Size: Normal)   Pulse (!) 53   Ht 5\' 7"  (1.702 m)   Wt 148 lb (67.1 kg)   SpO2 98%   BMI 23.18 kg/m     Wt Readings from Last 3 Encounters:  11/19/20 148 lb (67.1 kg)  04/25/20 142 lb 3.2 oz (64.5 kg)  11/06/19 146 lb 12.8 oz (66.6 kg)     GEN:  Well nourished, well developed in no acute distress he has no xanthoma  or xanthelasma HEENT: Normal NECK: No JVD; No carotid bruits LYMPHATICS: No lymphadenopathy CARDIAC: RRR, no murmurs, rubs, gallops RESPIRATORY:  Clear to auscultation without rales, wheezing or rhonchi  ABDOMEN: Soft, non-tender, non-distended MUSCULOSKELETAL:  No edema; No deformity  SKIN: Warm and dry NEUROLOGIC:  Alert and oriented x 3 PSYCHIATRIC:  Normal affect    Signed, 11/08/19, MD  11/19/2020 9:08 AM    [facility]

## 2020-11-19 ENCOUNTER — Ambulatory Visit: Payer: BC Managed Care – PPO | Admitting: Cardiology

## 2020-11-19 ENCOUNTER — Encounter: Payer: Self-pay | Admitting: Cardiology

## 2020-11-19 ENCOUNTER — Other Ambulatory Visit: Payer: Self-pay

## 2020-11-19 VITALS — BP 100/66 | HR 53 | Ht 67.0 in | Wt 148.0 lb

## 2020-11-19 DIAGNOSIS — I351 Nonrheumatic aortic (valve) insufficiency: Secondary | ICD-10-CM

## 2020-11-19 DIAGNOSIS — E785 Hyperlipidemia, unspecified: Secondary | ICD-10-CM | POA: Diagnosis not present

## 2020-11-19 DIAGNOSIS — E7841 Elevated Lipoprotein(a): Secondary | ICD-10-CM | POA: Diagnosis not present

## 2020-11-19 DIAGNOSIS — I2510 Atherosclerotic heart disease of native coronary artery without angina pectoris: Secondary | ICD-10-CM | POA: Diagnosis not present

## 2020-11-19 NOTE — Addendum Note (Signed)
Addended by: Roddie Mc on: 11/19/2020 09:43 AM   Modules accepted: Orders

## 2020-11-19 NOTE — Patient Instructions (Signed)
Medication Instructions:  Your physician recommends that you continue on your current medications as directed. Please refer to the Current Medication list given to you today.  *If you need a refill on your cardiac medications before your next appointment, please call your pharmacy*   Lab Work: Your physician recommends that you return for lab work in: TODAY Lipids, Lpa If you have labs (blood work) drawn today and your tests are completely normal, you will receive your results only by: Marland Kitchen MyChart Message (if you have MyChart) OR . A paper copy in the mail If you have any lab test that is abnormal or we need to change your treatment, we will call you to review the results.   Testing/Procedures: None   Follow-Up: At [facility], you and your health needs are our priority.  As part of our continuing mission to provide you with exceptional heart care, we have created designated Provider Care Teams.  These Care Teams include your primary Cardiologist (physician) and Advanced Practice Providers (APPs -  Physician Assistants and Nurse Practitioners) who all work together to provide you with the care you need, when you need it.  We recommend signing up for the patient portal called "MyChart".  Sign up information is provided on this After Visit Summary.  MyChart is used to connect with patients for Virtual Visits (Telemedicine).  Patients are able to view lab/test results, encounter notes, upcoming appointments, etc.  Non-urgent messages can be sent to your provider as well.   To learn more about what you can do with MyChart, go to [email or web address].    Your next appointment:   6 month(s)  The format for your next appointment:   In Person  Provider:   Norman Herrlich, MD   Other Instructions

## 2020-11-19 NOTE — Addendum Note (Signed)
Addended by: Delorse Limber I on: 11/19/2020 09:16 AM   Modules accepted: Orders

## 2020-11-20 ENCOUNTER — Telehealth: Payer: Self-pay

## 2020-11-20 LAB — LIPOPROTEIN A (LPA): Lipoprotein (a): 235.1 nmol/L — ABNORMAL HIGH (ref <75.0)

## 2020-11-20 LAB — LIPID PANEL
Chol/HDL Ratio: 1.7 ratio (ref 0.0–5.0)
Cholesterol, Total: 103 mg/dL (ref 100–199)
HDL: 59 mg/dL (ref >39)
LDL Chol Calc (NIH): 30 mg/dL (ref 0–99)
Triglycerides: 65 mg/dL (ref 0–149)
VLDL Cholesterol Cal: 14 mg/dL (ref 5–40)

## 2020-11-20 NOTE — Telephone Encounter (Signed)
Pt is returning call.  

## 2020-11-20 NOTE — Telephone Encounter (Signed)
-----   Message from Baldo Daub, MD sent at 11/20/2020  9:06 AM EDT ----- His lipid profile remains optimal however LP(a) remains severely elevated. Yesterday we decided to refer to lipid clinic regarding inclisiran to continue with that plan

## 2020-11-20 NOTE — Telephone Encounter (Signed)
Left message on patients voicemail to please return our call.   

## 2020-11-20 NOTE — Telephone Encounter (Signed)
Spoke with patient regarding results and recommendation.  Patient verbalizes understanding and is agreeable to plan of care. Advised patient to call back with any issues or concerns.  

## 2020-11-25 ENCOUNTER — Ambulatory Visit (INDEPENDENT_AMBULATORY_CARE_PROVIDER_SITE_OTHER): Payer: BC Managed Care – PPO | Admitting: Pharmacist

## 2020-11-25 ENCOUNTER — Other Ambulatory Visit: Payer: Self-pay

## 2020-11-25 DIAGNOSIS — E785 Hyperlipidemia, unspecified: Secondary | ICD-10-CM

## 2020-11-25 DIAGNOSIS — E7841 Elevated Lipoprotein(a): Secondary | ICD-10-CM

## 2020-11-25 MED ORDER — PRALUENT 150 MG/ML ~~LOC~~ SOAJ: 1.0000 "pen " | SUBCUTANEOUS | 11 refills | Status: DC

## 2020-11-25 NOTE — Progress Notes (Signed)
Patient ID: Ayaansh Smail                 DOB: [birth year]                    MRN: [id number]     HPI: Notnamed Scholz is a 65 y.o. male patient referred to lipid clinic by Dr. Dulce Sellar. PMH is significant for CAD with anterior ST elevation MI 08//22/2020 with PCI and drug-eluting stent to the LAD EF 45% with subsequent normalization on follow-up echocardiogram, hyperlipidemia with poor statin intolerance elevated lipoprotein a level and mild aortic regurgitation. Dr. Dulce Sellar referred him to lipid clinic to discuss Leqvio.  Patient presents today accompanied by his wife. He states that Dr. Dulce Sellar has been following his LPa and it has been creping back up. LPa was 294 before starting Praluent. Was as low as 210, now 235.  Current Medications: atorvastatin 80mg  daily, Praluent 75mg  every 14 days Intolerances: none Risk Factors: elevated LPa, CAD LDL goal:  <55  Diet: cereal for breakfast, occasional egg and baccon Meat 4 times a week Veggies, fruits Tried to eat low sodium  Exercise: works out often  Family History:  Family History  Problem Relation Age of Onset  . CAD Father 43       Ended up with CABG  . CAD Paternal Uncle   . Heart attack Mother   . Hypertension Mother   . Parkinson's disease Mother   . Diabetes Sister   . Hypertension Sister   . Heart Problems Brother      Social History:  Social History   Socioeconomic History  . Marital status: Married    Spouse name: Not on file  . Number of children: Not on file  . Years of education: Not on file  . Highest education level: Not on file  Occupational History  . Not on file  Tobacco Use  . Smoking status: Never Smoker  . Smokeless tobacco: Never Used  Vaping Use  . Vaping Use: Never used  Substance and Sexual Activity  . Alcohol use: No    Alcohol/week: 0.0 standard drinks  . Drug use: No  . Sexual activity: Not on file  Other Topics Concern  . Not on file  Social History Narrative   Married   Patient is very  active, he walks usually 2 to 4 miles a day, and bikes 20 to 30 miles 2 times a week.   Non-smoker   Social Determinants of [place] Strain: Not on file  Food Insecurity: Not on file  Transportation Needs: Not on file  Physical Activity: Not on file  Stress: Not on file  Social Connections: Not on file     Labs:11/19/20 TC 103, TG 65, HDL 59, LDL 30, LPa 235  Past Medical History:  Diagnosis Date  . Acute MI, anterior wall (HCC) 02/12/2019  . Acute ST elevation myocardial infarction (STEMI) involving left anterior descending (LAD) coronary artery without development of Q waves (HCC) 02/12/2019  . Acute ST elevation myocardial infarction (STEMI) of anterior wall (HCC) 02/12/2019  . Aortic regurgitation 04/19/2019  . CAD (coronary artery disease), native coronary artery    a. cath 02/2019 S/p DES to mLAD for 100% stenosis   . CAD in native artery 02/20/2019  . Elevated lipoprotein A level 04/19/2019  . Glaucoma   . Hyperlipidemia LDL goal <70   . Hyperlipidemia with target LDL less than 70 02/12/2019  . Ischemic cardiomyopathy   .  LV dysfunction 02/20/2019    Current Outpatient Medications on File Prior to Visit  Medication Sig Dispense Refill  . Alirocumab (PRALUENT) 75 MG/ML SOAJ Inject 75 mg into the skin every 14 (fourteen) days. 2 pen 11  . aspirin 81 MG chewable tablet Chew 1 tablet (81 mg total) by mouth daily.    Marland Kitchen atorvastatin (LIPITOR) 80 MG tablet Take 1 tablet (80 mg total) by mouth 2 (two) times a week. 90 tablet 1  . brimonidine (ALPHAGAN) 0.2 % ophthalmic solution Place 1 drop into both eyes 2 (two) times daily.    . DUREZOL 0.05 % EMUL Place 1 drop into the left eye 3 (three) times daily.    . Ferrous Sulfate (IRON) 325 (65 Fe) MG TABS Take 1 tablet by mouth daily.    Marland Kitchen Fexofenadine HCl (ALLEGRA PO) Take 1 tablet by mouth daily.     . Flaxseed, Linseed, (FLAXSEED OIL PO) Take 1 capsule by mouth daily.    . fluticasone (FLONASE) 50 MCG/ACT nasal spray  Place 1 spray into both nostrils daily.    Marland Kitchen ibuprofen (ADVIL) 200 MG tablet Take 200 mg by mouth every 6 (six) hours as needed for mild pain.    . metoprolol tartrate (LOPRESSOR) 25 MG tablet Take 12.5 mg by mouth 2 (two) times daily.    . Misc Natural Products (JOINT SUPPORT COMPLEX PO) Take 1 tablet by mouth daily.    . Multiple Vitamins-Minerals (MENS 50+ MULTI VITAMIN/MIN PO) Take 1 tablet by mouth daily.    . nitroGLYCERIN (NITROSTAT) 0.4 MG SL tablet Place 0.4 mg under the tongue every 5 (five) minutes as needed for chest pain.    . Omega-3 Fatty Acids (OMEGA-3 FISH OIL PO) Take 1 capsule by mouth daily.    Marland Kitchen PROLENSA 0.07 % SOLN Place 1 drop into the left eye at bedtime.     No current facility-administered medications on file prior to visit.    Allergies  Allergen Reactions  . Sulfa Antibiotics Other (See Comments)    Pt unable to recall reaction    Assessment/Plan:  1. Hyperlipidemia - Patient's LDL is at goal of <55. LPa is elevated, but decreased from baseline. PCSK9i lower LPa 20-30% which we have seen. Leqvio only lowers LPa 14-26%. We discussed that insurance would not allow him to be on both. Since Wilber Bihari is currently more expensive, has no cardiovascular outcomes data available yet and does not lower LPa as much I do not feel this is the best option for patient. Will instead increase his Praluent to 150mg  q 14 days. He will repeat labs in 3 months in [place]. Currently a reduction of 20-30% in LPa is the best we can do as statins do not lower LPa (however they will lower the athrogenesity of LDL). New drugs targeting LPa are being studies. I have referred him to the [facility] to see if he qualifies for their upcoming LPa study.  We also discussed diet. I encouraged a mediterranean type diet. Advised to watch out for added sugars, especially in cereal and granola. Does not need to be as stringent with salt.  Thank you,  [place],  Pharm.D, BCPS, CPP [facility]  [address], [place], [place]  Phone: [phone]; Fax: [phone]

## 2020-11-25 NOTE — Patient Instructions (Addendum)
Please increase your Praluent to 150mg  every 14 days Stop by the [place] office to get lab work in 3 months  TIPS for Living a healthier life  SUGAR  Sugar is a huge problem in the modern day diet. Sugar is a HUGE contributor to heart disease, diabetes, high triglyceride levels, fatty liver diease and obesity. Sugar is hidden in almost all packaged foods/beverages. Added sugar is extra sugar that is added beyond what is naturally found. It adds no nutritional benefit to your body and can cause major harm. The American Heart Association recommends limiting added sugars to no more than 25g for women and 36 grams for men per day.  There are many names for sugar maltose, sucrose (names ending in "ose"), high fructose corn syrup, molasses, cane sugar, corn sweetener, raw sugar, syrup, honey or fruit juice concentrate.   One of the best ways to limit your added sugars is to stop drinking sweetened beverages such as soda, sweet tea, fruit juice or fancy coffee's. There is 65g of added sugars in one 20oz bottle of Coke!! That is equal to 6 donuts.   Pay attention and read all nutrition facts labels. Below is an examples of a nutrition facts label. The #1 is showing you the total sugars where the # 2 is showing you the added sugars. This one severing has almost the max amount of added sugars per day!  Watch out for items that say "low fat" or "no added sugar" as these products are typically very high in sugar. The food industry uses these terms to fool you into thinking they are healthy.  For more information on the dangers of sugar watch WHY Sugar is as Bad as Alcohol (Fructose, The Liver Toxin) on YouTube.    EXERCISE  Exercise is good. We've all heard that. In an ideal world, we would all have time and resources to  get plenty of it. When you are active your heart pumps more efficiently and you will feel better.  Multiple studies show that even walking regularly has benefits that include living a  longer life.  The American Heart Association recommends 90-150 minutes per week of exercise (30 minutes  per day most days of the week). You can do this in any increment you wish. Nine or more  10-minute walks count. So does an hour-long exercise class. Break the time apart into what will  work in your life. Some of the best things you can do include walking briskly, jogging, cycling or  swimming laps. Not everyone is ready to "exercise." Sometimes we need to start with just getting active. Here  are some easy ways to be more active throughout the day: Take the stairs instead of the elevator . Go for a 10-15 minute walk during your lunch break (find a friend to make it more enjoyable) . When shopping, park at the back of the parking lot . If you take public transportation, get off one stop early and walk the extra distance . Pace around while making phone calls (most of Marland Kitchen are not attached to phone cords any longer!) Check with your doctor if you aren't sure what your limitations may be. Always remember to drink plenty of water when doing any type of exercise. Mckennon't feel like a failure if you're not getting the 90-150 minutes per week. If you started by being  a couch potato, then just a 10-minute walk each day is a huge improvement. Start with little  victories and work your  way up.   Healthy Eating Tips  When looking to improve your eating habits, whether to lose weight, lower blood pressure or just be healthier, it helps to know what a serving size is.   Grains 1 slice of bread,  bagel,  cup pasta or rice  Vegetables 1 cup fresh or raw vegetables,  cup cooked or canned Fruits 1 piece of medium sized fruit,  cup canned,   Meats/Proteins  cup dried       1 oz meat, 1 egg,  cup cooked beans, nuts or seeds  Dairy        Fats Individual yogurt container, 1 cup (8oz)    1 teaspoon margarine/butter or vegetable  milk or milk alternative, 1 slice of cheese          oil; 1  tablespoon mayonnaise or salad dressing                  Plan ahead: make a menu of the meals for a week then create a grocery list to go with  that menu. Consider meals that easily stretch into a night of leftovers, such as stews or  casseroles. Or consider making two of your favorite meal and put one in the freezer or fridge for  another night.  When you get home from the grocery store wash and prepare your vegetables and fruits.  Then when you need them they are ready to go.  Tips for going to the grocery store: . Buy store or generic brands . Check the weekly ad from your store on-line or in their in-store flyer . Look at the unit price on the shelf tag to compare/contrast the costs of different items . Buy fruits/vegetables in season . Carrots, bananas and apples are low-cost, naturally healthy items . If meats or frozen vegetables are on sale, buy some extras and put in your freezer . Limit buying prepared or "ready to eat" items, even if they are pre-made salads or fruit snacks . Do not shop when you're hungry . Foods at eye level tend to be more expensive. Look on the high and low shelves for deals. . Consider shopping at the farmer's market for fresh foods in season. . Choose canned tuna or salmon instead of fresh . Avoid the cookie and chip aisles (these are expensive, high in calories and low in  nutritional value). Shop on the outside of the grocery store.  Aim to have one 12 hour fast each day. This means no eating after dinner until breakfast. For example, if you eat dinner around 6 PM then you would not eat anything until 6 AM the next day. This is a great way to help lower your insulin levels, lose weight and reduce your blood pressure.   Healthy food preparations: . If you can't get lean hamburger, be sure to drain the fat when cooking . Steam, saut (in olive oil), grill or bake foods  . Resources: American Heart Association - MartiniMobile.it Go to the  Healthy Living tab to get more information American Diabetes Association - www.diabetes.org You Tay[place]'t have to be diabetic - check out the Food and Fitness tab

## 2021-01-30 ENCOUNTER — Other Ambulatory Visit: Payer: Self-pay

## 2021-01-30 DIAGNOSIS — Z006 Encounter for examination for normal comparison and control in clinical research program: Secondary | ICD-10-CM

## 2021-01-30 NOTE — Research (Signed)
Subject # [id number] Amgen Lp(a) [id number] Site # [phone]  SEX _0    Male                      _1    Male  Ethnicity _2   Hispanic or Latino   _3   Not Hispanic or Latino  Race _4   White                 _5   Black or African American  _6   Asian _7   American [place] or [place] Native            _8   Native Hawaiian or Other Pacific Islander                      _9   Other  Other   Age 65  Subject Group _10    Local Lab           _11   Historical Lp(a) value                                       Results: 235.1  Future research _12    Yes                    _13   No    Amgen [id number] Site # [id number] Subject ID # [id number]         ELIGIBILITY CRITERIA WORKSHEET INCLUSION CRITERIA   Subject has provided informed consent prior to the initiation of any study specific activities/procedures _14   Age 16 to 32 years _15   MI (presumed type 1) OR _16   PCI (with high-risk features) with at least 1 of the following: _17   Age >65 _18   Diabetes mellitus  HbA1c: _19   History of ischemic stroke _20   History of peripheral arterial disease _21   Residual stenosis ? 50% _22   Multivessel PCI (ie, ? 2 vessels, including branch arteries _23   EXCLUSIONS THE FOLLOWING N/A _24   Subjects known to be currently receiving investigational drug in a clinical study that is anticipated to last > 1 year _25   Known Lp(a) value <62m/dL or < 200nmol/L _26   Subject has a diagnosis of end-stage renal disease or requires dialysis. _27   Poorly controlled (glycated hemoglobin [HbA1c] > 10%) diabetes mellitus (type 1 or type 2) _28   Subject is receiving or has received lipoprotein apheresis to reduce Lp(a) within 3 months prior to enrollment. _29   Known uncontrolled or recurrent ventricular tachycardia in the past 3 months prior to enrollment. _30   Known malignancy (except non-melanoma skin cancers, cervical in situ carcinoma, breast ductal carcinoma in situ, or stage 1 prostate carcinoma) within the last 5 years prior to enrollment. _31   Known  history or evidence of clinically significant disease (eg, respiratory, gastrointestinal, or psychiatric disease) or unstable disorder or biomarker that, in the opinion of the investigator(s), would result in life expectancy < 5 years. _32   Known hemorrhagic stroke. _33    AMGEN LP(a) Informed Consent   Subject Name: Samuel [name] Subject met inclusion and exclusion criteria.  The informed consent form, study requirements and expectations were reviewed with the subject and questions and concerns were addressed prior to the signing of the consent form.  The subject verbalized understanding of the trial requirements.  The subject agreed to participate in the [facility] and signed the informed consent at 154 on 01/30/21.  The informed consent was obtained prior to  performance of any protocol-specific procedures for the subject.  A copy of the signed informed consent was given to the subject and a copy was placed in the subject's medical record.   Chanda Busing  Amgem Consent Version 2 Protocol Version 2

## 2021-02-11 MED ORDER — REPATHA SURECLICK 140 MG/ML ~~LOC~~ SOAJ: 1.0000 "pen " | SUBCUTANEOUS | 11 refills | Status: DC

## 2021-02-12 IMAGING — DX PORTABLE CHEST - 1 VIEW
1 series · 1 of 1 positions shown · non-contrast
Comparison: None.

CLINICAL DATA: Acute ST elevation myocardial infarction (STEMI)
involving left anterior descending (LAD) coronary artery without
development of Q waves (HCC)

EXAM:
PORTABLE CHEST 1 VIEW

[chest]
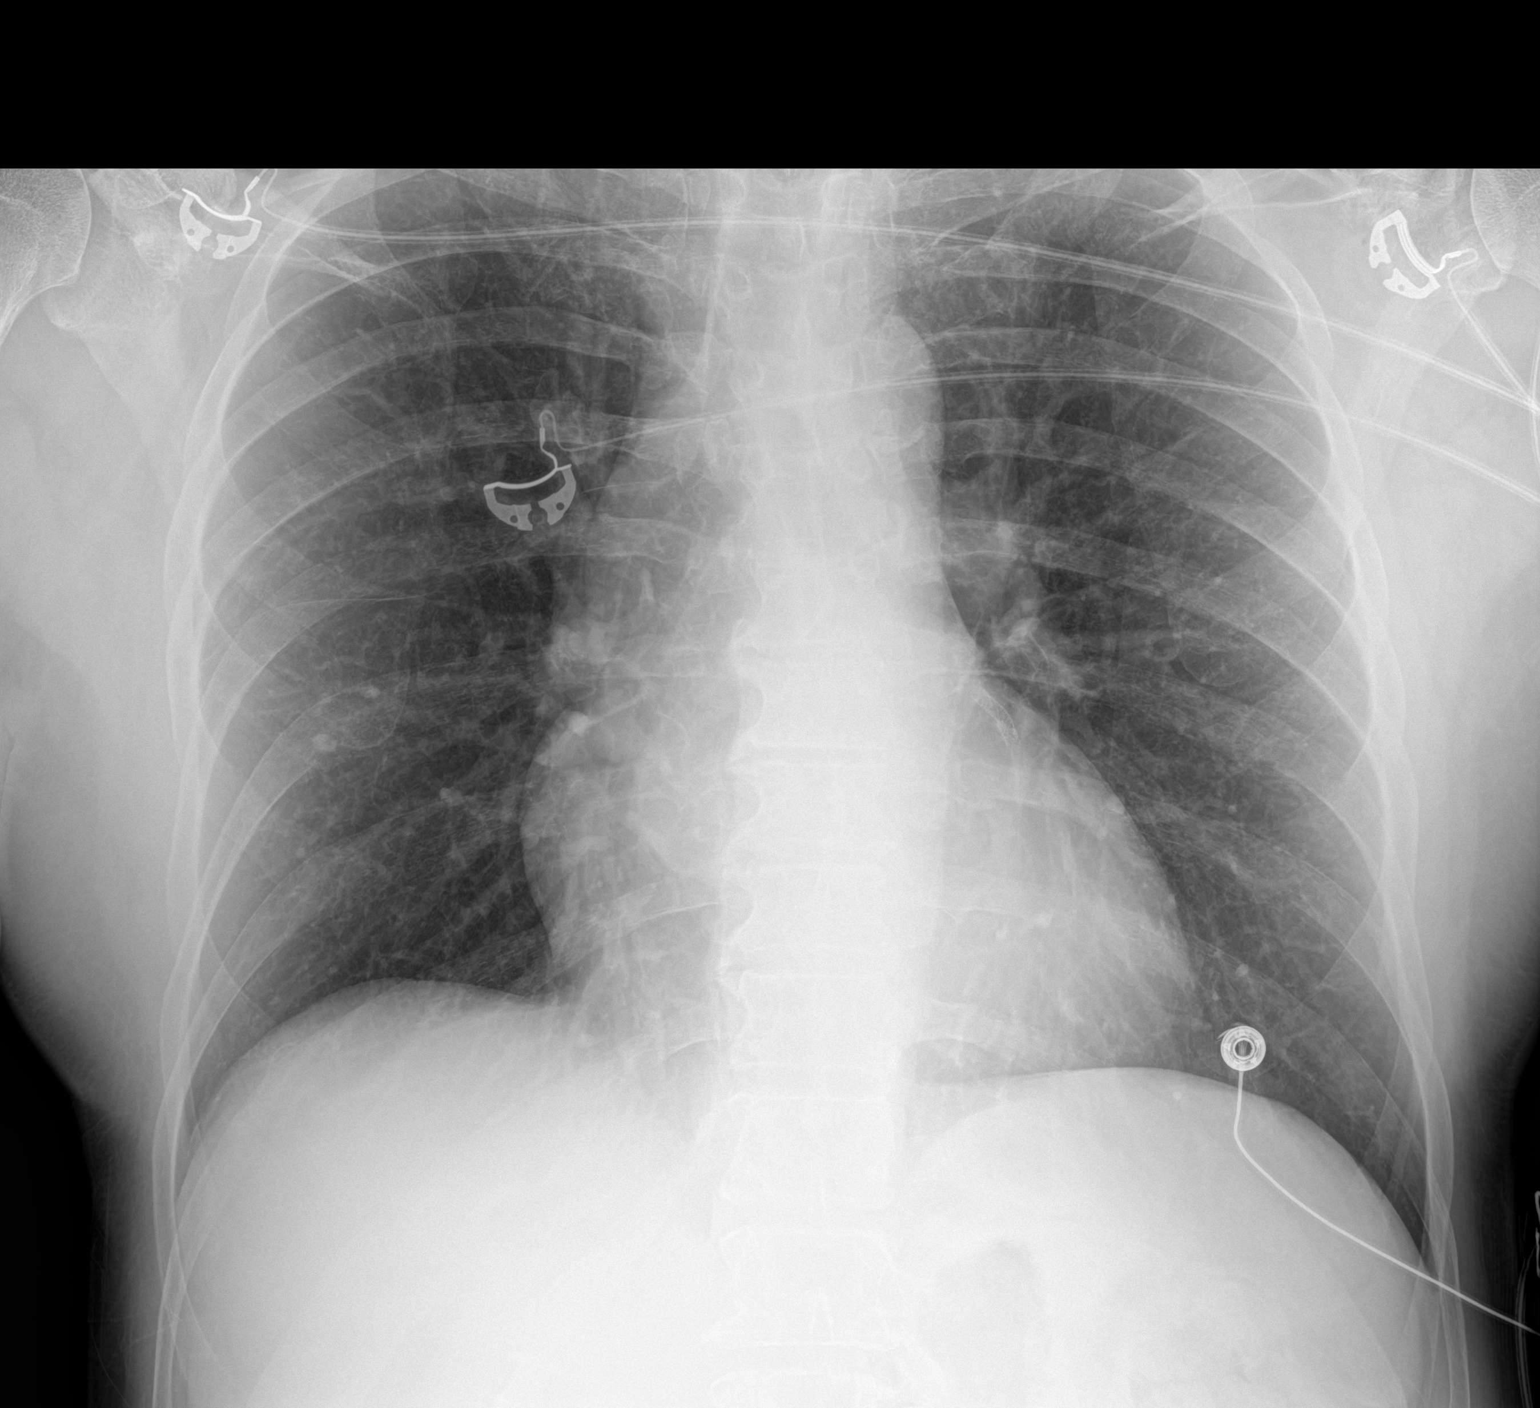

[1 of 1 positions shown; findings below may reference images not displayed]

FINDINGS: Normal heart size. Clear lung fields. No bony abnormality.
IMPRESSION: No active disease.

## 2021-02-17 ENCOUNTER — Telehealth: Payer: Self-pay

## 2021-02-17 NOTE — Telephone Encounter (Signed)
   [facility] Pre-operative Risk Assessment    Patient Name: Samuel [name]  DOB: 05-08-1956 MRN: [id number]  HEARTCARE STAFF:  - IMPORTANT!!!!!! Under Visit Info/Reason for Call, type in Other and utilize the format Clearance MM/DD/YY or Clearance TBD. Do not use dashes or single digits. - Please review there is not already an duplicate clearance open for this procedure. - If request is for dental extraction, please clarify the # of teeth to be extracted. - If the patient is currently at the dentist's office, call Pre-Op Callback Staff (MA/nurse) to input urgent request.  - If the patient is not currently in the dentist office, please route to the Pre-Op pool.  Request for surgical clearance:  What type of surgery is being performed? Extraction of tooth #31   When is this surgery scheduled? TBD  What type of clearance is required (medical clearance vs. Pharmacy clearance to hold med vs. Both)? Both  Are there any medications that need to be held prior to surgery and how long? None noted  Practice name and name of physician performing surgery? [facility]  What is the office phone number? [phone]   7.   What is the office fax number? [phone]  8.   Anesthesia type (None, local, MAC, general) ? Local anesthesia and nitrous oxide.    CLEOPHUS MENDONSA 02/17/2021, 1:29 PM  _________________________________________________________________   (provider comments below)

## 2021-02-17 NOTE — Telephone Encounter (Signed)
   Primary Cardiologist: Norman Herrlich, MD  Chart reviewed as part of pre-operative protocol coverage. Simple dental extractions are considered low risk procedures per guidelines and generally do not require any specific cardiac clearance. It is also generally accepted that for simple extractions and dental cleanings, there is no need to interrupt blood thinner therapy.   SBE prophylaxis is not required for the patient.  I will route this recommendation to the requesting party via Epic fax function and remove from pre-op pool.  Please call with questions.  Ronney Asters, NP 02/17/2021, 1:43 PM

## 2021-02-18 ENCOUNTER — Ambulatory Visit: Payer: Self-pay | Admitting: *Deleted

## 2021-02-19 NOTE — Telephone Encounter (Signed)
Attempted to contact pt at his home number however no answer.   An attempt was made yesterday to call him back without success.   There was no information or reason given for his call.   Not sure which line he called in on.  He is not a pt of one of the practices the Patient Engagement Center services.  I will close his chart out for now.

## 2021-02-27 LAB — LIPID PANEL
Chol/HDL Ratio: 1.8 ratio (ref 0.0–5.0)
Cholesterol, Total: 92 mg/dL — ABNORMAL LOW (ref 100–199)
HDL: 51 mg/dL (ref >39)
LDL Chol Calc (NIH): 27 mg/dL (ref 0–99)
Triglycerides: 60 mg/dL (ref 0–149)
VLDL Cholesterol Cal: 14 mg/dL (ref 5–40)

## 2021-02-27 LAB — LIPOPROTEIN A (LPA): Lipoprotein (a): 247.2 nmol/L — ABNORMAL HIGH (ref <75.0)

## 2021-02-28 ENCOUNTER — Telehealth: Payer: Self-pay

## 2021-02-28 NOTE — Telephone Encounter (Signed)
Patient notified of results.

## 2021-02-28 NOTE — Telephone Encounter (Signed)
-----   Message from Baldo Daub, MD sent at 02/27/2021  7:26 PM EDT ----- Good result no changes

## 2021-03-30 ENCOUNTER — Other Ambulatory Visit: Payer: Self-pay | Admitting: Cardiology

## 2021-04-20 ENCOUNTER — Other Ambulatory Visit: Payer: Self-pay | Admitting: Cardiology

## 2021-05-22 ENCOUNTER — Other Ambulatory Visit: Payer: Self-pay

## 2021-05-22 ENCOUNTER — Ambulatory Visit: Payer: Medicare PPO | Admitting: Cardiology

## 2021-05-22 ENCOUNTER — Encounter: Payer: Self-pay | Admitting: Cardiology

## 2021-05-22 VITALS — BP 120/70 | HR 62 | Ht 67.0 in | Wt 148.0 lb

## 2021-05-22 DIAGNOSIS — E785 Hyperlipidemia, unspecified: Secondary | ICD-10-CM | POA: Diagnosis not present

## 2021-05-22 DIAGNOSIS — E7841 Elevated Lipoprotein(a): Secondary | ICD-10-CM

## 2021-05-22 DIAGNOSIS — I2510 Atherosclerotic heart disease of native coronary artery without angina pectoris: Secondary | ICD-10-CM | POA: Diagnosis not present

## 2021-05-22 DIAGNOSIS — I351 Nonrheumatic aortic (valve) insufficiency: Secondary | ICD-10-CM

## 2021-05-22 DIAGNOSIS — I519 Heart disease, unspecified: Secondary | ICD-10-CM

## 2021-05-22 DIAGNOSIS — H04123 Dry eye syndrome of bilateral lacrimal glands: Secondary | ICD-10-CM

## 2021-05-22 HISTORY — DX: Dry eye syndrome of bilateral lacrimal glands: H04.123

## 2021-05-22 NOTE — Patient Instructions (Signed)

## 2021-05-22 NOTE — Progress Notes (Signed)
Cardiology Office Note:    Date:  05/22/2021   ID:  Samuel [name], DOB [birth year], MRN [id number]  PCP:  Lise Auer, MD  Cardiologist:  Norman Herrlich, MD    Referring MD: Lise Auer, MD    ASSESSMENT:    1. CAD in native artery   2. LV dysfunction   3. Hyperlipidemia LDL goal <70   4. Elevated lipoprotein A level   5. Aortic valve insufficiency, etiology of cardiac valve disease unspecified    PLAN:    In order of problems listed above:  Mr. Holtmeyer continues to do well with CAD following PCI and current medical treatment continue beta-blocker aspirin high intensity statin and Repatha I think he is an ideal candidate for the Ocean study for elevated LP(a) and hopefully enrollment will start in the next month I referred him to the study coordinator. Stable EF is normalized following PCI Stable aortic regurgitation does not need a repeat echocardiogram    Next appointment: 6 months   Medication Adjustments/Labs and Tests Ordered: Current medicines are reviewed at length with the patient today.  Concerns regarding medicines are outlined above.  No orders of the defined types were placed in this encounter.  No orders of the defined types were placed in this encounter.   Chief Complaint  Patient presents with   Follow-up   Coronary Artery Disease    History of Present Illness:    Samuel [name] is a 65 y.o. male with a hx of CAD with anterior ST elevation MI 03/04/2019 PCI and drug-eluting stent to the LAD initially after reduced 45% with subsequent normalization on follow-up echocardiogram hyperlipidemia poor statin tolerance and elevated LP(a) and mild aortic regurgitation last seen 11/19/2020.  Compliance with diet, lifestyle and medications: Yes  He has about transition back to Praluent but he is taking Repatha because of benefit management. Nothing specific just does not feel as well and he said it may have affected his mood a little bit. He is not having angina edema  shortness of breath palpitations syncope and tolerates his statin without muscle pain or weakness  LP(a) is diminished but remains severely elevated. Component Ref Range & Units 6 mo ago  (11/19/20) 1 yr ago  (04/25/20) 1 yr ago  (11/06/19) 1 yr ago  (07/28/19) 2 yr ago  (03/06/19)  Lipoprotein (a) <75.0 nmol/L 235.1 High   234.9 High  CM  210.6 High  CM  266.4 High  CM  294.9 High    Past Medical History:  Diagnosis Date   Acute MI, anterior wall (HCC) 02/12/2019   Acute ST elevation myocardial infarction (STEMI) involving left anterior descending (LAD) coronary artery without development of Q waves (HCC) 02/12/2019   Acute ST elevation myocardial infarction (STEMI) of anterior wall (HCC) 02/12/2019   Aortic regurgitation 04/19/2019   CAD (coronary artery disease), native coronary artery    a. cath 02/2019 S/p DES to mLAD for 100% stenosis    CAD in native artery 02/20/2019   Elevated lipoprotein A level 04/19/2019   Glaucoma    Hyperlipidemia LDL goal <70    Hyperlipidemia with target LDL less than 70 02/12/2019   Ischemic cardiomyopathy    LV dysfunction 02/20/2019    Past Surgical History:  Procedure Laterality Date   CARDIAC CATHETERIZATION     CATARACT EXTRACTION, BILATERAL Bilateral 10/2020   Glaucoma repair   CORONARY/GRAFT ACUTE MI REVASCULARIZATION N/A 02/12/2019   Procedure: Coronary/Graft Acute MI Revascularization;  Surgeon: Marykay Lex, MD;  Location:  [facility] INVASIVE CV LAB;  Service: Cardiovascular;  Laterality: N/A;   HAND SURGERY     right   LEFT HEART CATH AND CORONARY ANGIOGRAPHY N/A 02/12/2019   Procedure: LEFT HEART CATH AND CORONARY ANGIOGRAPHY;  Surgeon: Marykay Lex, MD;  Location: [facility] INVAS[facility] CV LAB;  Service: Cardiovascular;  Laterality: N/A;    Current Medications: Current Meds  Medication Sig   aspirin 81 MG chewable tablet Chew 1 tablet (81 mg total) by mouth daily.   atorvastatin (LIPITOR) 80 MG tablet Take 80 mg by mouth 2 (two) times a week.    Carboxymethylcellul-Glycerin (CLEAR EYES FOR DRY EYES OP) Apply 1 drop to eye daily as needed (dry eyes).   Evolocumab (REPATHA SURECLICK) 140 MG/ML SOAJ Inject 1 pen into the skin every 14 (fourteen) days.   Ferrous Sulfate (IRON) 325 (65 Fe) MG TABS Take 1 tablet by mouth daily.   Fexofenadine HCl (ALLEGRA PO) Take 1 tablet by mouth daily.    Flaxseed, Linseed, (FLAXSEED OIL PO) Take 1 capsule by mouth daily.   fluticasone (FLONASE) 50 MCG/ACT nasal spray Place 1 spray into both nostrils daily.   ibuprofen (ADVIL) 200 MG tablet Take 200 mg by mouth every 6 (six) hours as needed for mild pain.   metoprolol tartrate (LOPRESSOR) 25 MG tablet TAKE 1/2 TABLET (12.5 MG TOTAL) BY MOUTH 2 TIMES DAILY.   Misc Natural Products (JOINT SUPPORT COMPLEX PO) Take 1 tablet by mouth daily.   Multiple Vitamins-Minerals (MENS 50+ MULTI VITAMIN/MIN PO) Take 1 tablet by mouth daily.   nitroGLYCERIN (NITROSTAT) 0.4 MG SL tablet Place 0.4 mg under the tongue every 5 (five) minutes as needed for chest pain.   Omega-3 Fatty Acids (OMEGA-3 FISH OIL PO) Take 1 capsule by mouth daily.     Allergies:   Sulfa antibiotics   Social History   Socioeconomic History   Marital status: Married    Spouse name: Not on file   Number of children: Not on file   Years of education: Not on file   Highest education level: Not on file  Occupational History   Not on file  Tobacco Use   Smoking status: Never   Smokeless tobacco: Never  Vaping Use   Vaping Use: Never used  Substance and Sexual Activity   Alcohol use: No    Alcohol/week: 0.0 standard drinks   Drug use: No   Sexual activity: Not on file  Other Topics Concern   Not on file  Social History Narrative   Married   Patient is very active, he walks usually 2 to 4 miles a day, and bikes 20 to 30 miles 2 times a week.   Non-smoker   Social Determinants of Corporate investment banker Strain: Not on file  Food Insecurity: Not on file  Transportation Needs: Not  on file  Physical Activity: Not on file  Stress: Not on file  Social Connections: Not on file     Family History: The patient's family history includes CAD in his paternal uncle; CAD (age of onset: 18) in his father; Diabetes in his sister; Heart Problems in his brother; Heart attack in his mother; Hypertension in his mother and sister; Parkinson's disease in his mother. ROS:   Please see the history of present illness.    All other systems reviewed and are negative.  EKGs/Labs/Other Studies Reviewed:    The following studies were reviewed today:  EKG:  EKG from last visit 11/19/2020 showed sinus rhythm right bundle branch block  Recent Labs: No results found for requested labs within last 8760 hours.  Recent Lipid Panel    Component Value Date/Time   CHOL 92 (L) 02/26/2021 0850   TRIG 60 02/26/2021 0850   HDL 51 02/26/2021 0850   CHOLHDL 1.8 02/26/2021 0850   CHOLHDL 2.9 02/12/2019 1231   VLDL 24 02/12/2019 1231   LDLCALC 27 02/26/2021 0850    Physical Exam:    VS:  BP 120/70 (BP Location: Right Arm, Patient Position: Sitting, Cuff Size: Normal)   Pulse 62   Ht 5\' 7"  (1.702 m)   Wt 148 lb (67.1 kg)   SpO2 97%   BMI 23.18 kg/m     Wt Readings from Last 3 Encounters:  05/22/21 148 lb (67.1 kg)  11/19/20 148 lb (67.1 kg)  04/25/20 142 lb 3.2 oz (64.5 kg)     GEN:  Well nourished, well developed in no acute distress HEENT: Normal NECK: No JVD; No carotid bruits LYMPHATICS: No lymphadenopathy CARDIAC: RRR, no murmurs, rubs, gallops RESPIRATORY:  Clear to auscultation without rales, wheezing or rhonchi  ABDOMEN: Soft, non-tender, non-distended MUSCULOSKELETAL:  No edema; No deformity  SKIN: Warm and dry NEUROLOGIC:  Alert and oriented x 3 PSYCHIATRIC:  Normal affect    Signed, 04/27/20, MD  05/22/2021 11:41 AM    [facility]

## 2021-06-17 ENCOUNTER — Encounter: Payer: Self-pay | Admitting: Pharmacist

## 2021-06-17 MED ORDER — NITROGLYCERIN 0.4 MG SL SUBL: 0.4000 mg | SUBLINGUAL_TABLET | SUBLINGUAL | 1 refills | Status: DC | PRN

## 2021-06-27 ENCOUNTER — Encounter: Payer: Self-pay | Admitting: Cardiology

## 2021-07-15 ENCOUNTER — Telehealth: Payer: Self-pay

## 2021-07-15 NOTE — Telephone Encounter (Signed)
PA submitted on CMM for Repatha 140 mg. PA approved for 07/13/2020 and ending on 07/12/2022.

## 2021-08-19 ENCOUNTER — Other Ambulatory Visit: Payer: Self-pay

## 2021-08-19 VITALS — BP 134/63 | HR 62 | Ht 67.0 in | Wt 152.8 lb

## 2021-08-19 DIAGNOSIS — Z006 Encounter for examination for normal comparison and control in clinical research program: Secondary | ICD-10-CM

## 2021-08-19 NOTE — Research (Signed)
° °   OCEANa Screening Visit [id number] SUBJECT DU:[id number]        DATE: 08/19/2021                             The following was completed during visit: [x]  CONSENT SIGNED [x]  INCLUSION/EXCLUSION MET [x]  MEASUREMENTS TAKEN HEIGHT:  5'7"              WEIGHT: 152.8lbs WAIST CIRMCUMFERENCE:   97cm           HIP CIRCUMFERENCE:100cm B/P:   134/62                         HR: 62 []  SERUM PREGNANCY TEST/FSH-NOT APPLICABLE [x]  FASTING GLUCOSE/HbA1c [x]  FASTING LIPID PANELY [x]  LPa [x]  CHEMISTRY/HEMATOLOGY [x]  MEDICATIONS REVIEWED [x]  Laren Boom [id number]      SITE [id number] SUBJECT ID: [id number]        DATE: 08/19/2021     [x]  MALE                            []  MALE AGE: 66 ETHINICITY:   []  HISPANIC/LATINO      [x]  NON- HISPANIC/LATINO RACE:           [x]   WHITE             []  BLACK/AFRICAN AMERICAN                         []   ASIAN              []  AMERICAN INDIAN/[place] NATIVE                         []   NATIVE [place]AN/OTHER PACIFIC ISLANDER                         []   OTHER  FUTURE RESEARCH [x]  USE OF SAMPLES FOR FUTURE RESEARCH [x]  PHARMACOGENTIC (GENETIC) RESEARCH   Patient was seen in clinic for screening visit for [facility]. Labs, measurements, and vital signs taken per protocol. Patient was informed that if all labs still meet criteria will call and schedule the randomization visit

## 2021-08-19 NOTE — Research (Signed)
[name] Informed Consent   Subject Name: [facility]  Subject met inclusion and exclusion criteria.  The informed consent form, study requirements and expectations were reviewed with the subject and questions and concerns were addressed prior to the signing of the consent form.  The subject verbalized understanding of the trial requirements.  The subject agreed to participate in the [facility] trial and signed the informed consent at 0938 on 08/19/2021.  The informed consent was obtained prior to performance of any protocol-specific procedures for the subject.  A copy of the signed informed consent was given to the subject and a copy was placed in the subject's medical record.   [place]  Amgem Consent Version 2 Protocol Version 1 amendment 1

## 2021-08-21 NOTE — Research (Addendum)
° ° ° ° ° ° ° ° ° °  ABNORMAL LABS: TEST RESULT  glucose 108  Apo B 36.0         Are these clinically significant? []  YES              [x]  NO   , MD, [facility], FACP  [place]   [facility]  Medical Director of the Advanced Lipid Disorders &  Cardiovascular Risk Reduction Clinic Diplomate of the American Board of Clinical Lipidology Attending Cardiologist  Direct Dial: [phone]   Fax: [phone]  Website:  [email or web address]

## 2021-09-11 ENCOUNTER — Other Ambulatory Visit: Payer: Self-pay

## 2021-09-11 DIAGNOSIS — Z006 Encounter for examination for normal comparison and control in clinical research program: Secondary | ICD-10-CM

## 2021-09-11 MED ORDER — STUDY - OCEAN(A) - OLPASIRAN (AMG 890) 142 MG/ML OR PLACEBO SQ INJECTION (PI-HILTY)
142.0000 mg | PREFILLED_SYRINGE | Freq: Once | SUBCUTANEOUS | Status: AC
  Administered 2021-09-11: 142 mg via SUBCUTANEOUS
  Filled 2021-09-11: qty 1

## 2021-09-11 NOTE — Research (Signed)
Patient seen in clinic for randomization visit for [facility]). Medications, exclusions/inclusions reviewed with patient. Denies any changes in medical history since his screening visit. Labs drawn at 0815 per protocol. IP was given in the RUQ. Patient denies any complications. Patient waited in clinic for 30 minutes after IP given without any side effects noted. ?AMGEN [id number] SITE #: [id number] SUBJECT ID#: ?[id number]  ?                  ELIGIBLITY CRITERIA WORKSHEET ?                            INCLUSION CRITERIA ?PROVIDED INFORMED CONSENT [x]   ?AGE 66 TO ? 85 [x]   ?Lp(a) ? 200 nmol/L during screening by central lab [x]   ?MI (presumed type 1 event due to plaque rupture       AND/OR [x]   ?PCI stenting AND one of the following: []   ?AGE ? 65 []   ?Residual coronary artery stenosis > 50% in any major vessel []   ?Multivessel PCI []   ?Symptomatic peripheral arterial disease with either (a) intermittent claudication with ankle-brachial index (ABI) < 0.85, or (b) peripheral arterial revascularization or amputation due to atherosclerotic disease []   ?Ischemic stroke (presumed atherosclerotic in origin) []   ?DM    HbA1c:  _________________ []   ?                     EXCLUSION CRITERIA           N/A                          [x]  ?Severe renal function eGFR < 36m/min/1.73 m? []   ?Active liver disease, known hepatitis, or any hepatic dysfunction, defined as AST or ALT > 3 x upper limit of normal, or total bilirubin > 2 x ULN during screening []   ?History of hemorrhagic stroke []   ?History of major bleeding disorder []   ?Major cardiovascular event within 4 weeks prior to Lp(a) screening or during screening []   ?Planned cardiac or arterial revascularization []   ?Malignancy within the last 5 years prior to Study day 1 (except non-melanoma skin cancers, cervical and breast ductal carcinoma in situ, or stage 1 prostate) []   ?Severe Heart Failure Class IV, and/or left EF < 30% []   ?Uncontrolled or recurrent Ventricular tachycardia in the  last 3 months []   ?A fib/flutter not on therapeutic anticoagulation or having placement of left atrial appendage closure device []   ?Uncontrolled HTN at screening, systolic > 1329JJOAor diastolic > 1416SAYT[]   ?Fasting triglycerides > 4035mdL during screening []   ?HbA1c > 10% []   ?Know major active infection []   ?Current or planned lipoprotein apheresis or < 3 months since last apheresis []   ?Previously received RNA therapy specifically targeting Lp(a) []   ?  ? ?Current Outpatient Medications:  ?  aspirin 81 MG chewable tablet, Chew 1 tablet (81 mg total) by mouth daily., Disp:  , Rfl:  ?  atorvastatin (LIPITOR) 80 MG tablet, Take 80 mg by mouth 2 (two) times a week., Disp: , Rfl:  ?  Evolocumab (REPATHA SURECLICK) 14016G/ML SOAJ, Inject 1 pen into the skin every 14 (fourteen) days., Disp: 2 mL, Rfl: 11 ?  Ferrous Sulfate (IRON) 325 (65 Fe) MG TABS, Take 1 tablet by mouth daily., Disp: , Rfl:  ?  Fexofenadine HCl (ALLEGRA PO), Take 1 tablet by mouth daily. , Disp: , Rfl:  ?  Flaxseed, Linseed, (FLAXSEED OIL PO), Take 1 capsule by mouth daily., Disp: , Rfl:  ?  fluticasone (FLONASE) 50 MCG/ACT nasal spray, Place 1 spray into both nostrils daily., Disp: , Rfl:  ?  ibuprofen (ADVIL) 200 MG tablet, Take 200 mg by mouth every 6 (six) hours as needed for mild pain., Disp: , Rfl:  ?  metoprolol tartrate (LOPRESSOR) 25 MG tablet, TAKE 1/2 TABLET (12.5 MG TOTAL) BY MOUTH 2 TIMES DAILY., Disp: 90 tablet, Rfl: 1 ?  Misc Natural Products (JOINT SUPPORT COMPLEX PO), Take 1 tablet by mouth daily., Disp: , Rfl:  ?  Multiple Vitamins-Minerals (MENS 50+ MULTI VITAMIN/MIN PO), Take 1 tablet by mouth daily., Disp: , Rfl:  ?  nitroGLYCERIN (NITROSTAT) 0.4 MG SL tablet, Place 1 tablet (0.4 mg total) under the tongue every 5 (five) minutes as needed for chest pain., Disp: 28 tablet, Rfl: 1 ?  Omega-3 Fatty Acids (OMEGA-3 FISH OIL PO), Take 1 capsule by mouth daily., Disp: , Rfl:  ?  Carboxymethylcellul-Glycerin (CLEAR EYES FOR DRY EYES  OP), Apply 1 drop to eye daily as needed (dry eyes)., Disp: , Rfl:   ?

## 2021-09-15 NOTE — Research (Addendum)
[name] DAY 1 RANDOMIZATION VISIT ? ? ? ? ? ? ? ? ?ABNORMAL LABS: ?TEST RESULT  ?Apo B 34.0  ?RBC 3.9  ?HCT 36  ?   ? ?Are these clinically significant? ?[]  YES              [x]  NO  ? ? , MD, [facility], FACP  ?[place]  [facility]  ?Medical Director of the Advanced Lipid Disorders &  ?Cardiovascular Risk Reduction Clinic ?Diplomate of the [name] of Clinical Lipidology ?Attending Cardiologist  ?Direct Dial: [phone]  Fax: [phone]  ?Website:  [email or web address] ? ? ? ? ?

## 2021-09-17 NOTE — Progress Notes (Signed)
Patient seen for randomization visit for Samuel [name] a protocol.  See inclusion and exclusion criteria.  Patient has prior MI with PCI.  LPa>200 ng.  ?No current symptoms.   ? ?Drug, randomization process, follow up discussed with patient in detail.  Prior studies reviewed (NEJM phase II).  Patient referred by Dr. Dulce Sellar.   Patient wishes to proceed with study.  ? ?BP 117/60 P64 ?Lungs clear ?No JVD.  ?Cor regular.  Normal S1 and S2.  Cannot appreciate AI murmur ?Abd soft ?No edema.  ? ?Patient enrolled in [place] a.  ? ?Arturo Morton. Riley Kill, MD, [facility] ?Medical Director, [facility] ?

## 2021-09-17 NOTE — Research (Signed)
? ? ? ?  Randomization Visit- no abnormal labs ?

## 2021-09-19 NOTE — Research (Addendum)
LP(a) Results for Day 1 Randomization ? ?

## 2021-09-25 ENCOUNTER — Other Ambulatory Visit: Payer: Self-pay | Admitting: Cardiology

## 2021-09-30 ENCOUNTER — Encounter: Payer: Self-pay | Admitting: Cardiology

## 2021-10-09 DIAGNOSIS — Z006 Encounter for examination for normal comparison and control in clinical research program: Secondary | ICD-10-CM

## 2021-10-09 NOTE — Research (Signed)
Patient was seen in the clinic for Week 4 visit of the [name] trial. States he has been doing well since last visit. He did have issues with his blood pressure running higher than usual (168 at the highest) for him but it did come back down to his normal range of the 120s without any changes to medications. Denies any adverse events since Randomization visit. Medications reviewed and no changes were noted. Lab work completed per protocol and patient tolerated without complaints. No IP injection due at this visit. Nex appointment for Week 12 visit will be May 25th @ 8am. ?

## 2021-10-14 NOTE — Research (Addendum)
[name] Week 4 labs ? ? ? ? ? ? ? ? ? ?ABNORMAL LABS: ?TEST RESULT  ?glucose 110  ?   ?   ?   ? ?Are these clinically significant? ?[]  YES              [x]  NO  ? ? , MD, [facility], FACP  ?[place]  [facility]  ?Medical Director of the Advanced Lipid Disorders &  ?Cardiovascular Risk Reduction Clinic ?Diplomate of the [name] of Clinical Lipidology ?Attending Cardiologist  ?Direct Dial: [phone]  Fax: [phone]  ?Website:  [email or web address] ? ? ?

## 2021-10-29 ENCOUNTER — Other Ambulatory Visit: Payer: Self-pay | Admitting: *Deleted

## 2021-10-29 DIAGNOSIS — Z006 Encounter for examination for normal comparison and control in clinical research program: Secondary | ICD-10-CM

## 2021-10-29 MED ORDER — STUDY - OCEAN(A) - OLPASIRAN (AMG 890) 142 MG/ML OR PLACEBO SQ INJECTION (PI-HILTY): 142.0000 mg | PREFILLED_SYRINGE | Freq: Once | SUBCUTANEOUS | 0 refills | Status: AC

## 2021-10-29 NOTE — Progress Notes (Signed)
Order placed for ocean a study drug  ?

## 2021-11-21 DIAGNOSIS — H409 Unspecified glaucoma: Secondary | ICD-10-CM | POA: Insufficient documentation

## 2021-11-27 ENCOUNTER — Encounter: Payer: Self-pay | Admitting: Cardiology

## 2021-11-27 ENCOUNTER — Ambulatory Visit: Payer: Medicare PPO | Admitting: Cardiology

## 2021-11-27 VITALS — BP 128/64 | HR 52 | Ht 67.0 in | Wt 154.8 lb

## 2021-11-27 DIAGNOSIS — E7841 Elevated Lipoprotein(a): Secondary | ICD-10-CM | POA: Diagnosis not present

## 2021-11-27 DIAGNOSIS — E785 Hyperlipidemia, unspecified: Secondary | ICD-10-CM | POA: Diagnosis not present

## 2021-11-27 DIAGNOSIS — I2510 Atherosclerotic heart disease of native coronary artery without angina pectoris: Secondary | ICD-10-CM | POA: Diagnosis not present

## 2021-11-27 DIAGNOSIS — I351 Nonrheumatic aortic (valve) insufficiency: Secondary | ICD-10-CM

## 2021-11-27 NOTE — Progress Notes (Signed)
Cardiology Office Note:    Date:  11/27/2021   ID:  Samuel [name], DOB [birth year], MRN [id number]  PCP:  Lise AuerKhan, Jaber A, MD  Cardiologist:  Norman HerrlichBrian Dariyah Garduno, MD    Referring MD: Lise AuerKhan, Jaber A, MD    ASSESSMENT:    1. CAD in native artery   2. Hyperlipidemia LDL goal <70   3. Elevated lipoprotein A level   4. Aortic valve insufficiency, etiology of cardiac valve disease unspecified    PLAN:    In order of problems listed above:  Samuel [name] continues to do well having no angina after PCI Current medical treatment good lifestyle with a good exercise program and continue treatment including aspirin beta-blocker and lipid-lowering with intermittent statin and investigational drug therapy. Labs next week as part of his clinical trial Consider repeat echocardiogram next visit, LP(a) is associated with calcification and valve dysfunction.   Next appointment: 9 months   Medication Adjustments/Labs and Tests Ordered: Current medicines are reviewed at length with the patient today.  Concerns regarding medicines are outlined above.  Orders Placed This Encounter  Procedures   EKG 12-Lead   No orders of the defined types were placed in this encounter.   Follow-up CAD   History of Present Illness:    Samuel [name] is a 66 y.o. male with a hx of CAD with anterior ST elevation MI 03/04/2019 PCI and drug-eluting stent to the LAD EF initially reduced 45% subsequently normalized hyperlipidemia poor statin intolerance elevated LP(a) as well as mild aortic regurgitation last seen 05/22/2021.  He is enrolled in the [place] study..  Compliance with diet, lifestyle and medications: Yes  Overall doing well at times he gets an elevated blood pressure in the evening but generally is in good range. No angina dyspnea shortness of breath palpitation or syncope He is in a drug trial Ocean study. Follow-up labs next week Past Medical History:  Diagnosis Date   Acute MI, anterior wall (HCC) 02/12/2019   Acute ST  elevation myocardial infarction (STEMI) involving left anterior descending (LAD) coronary artery without development of Q waves (HCC) 02/12/2019   Acute ST elevation myocardial infarction (STEMI) of anterior wall (HCC) 02/12/2019   Aortic regurgitation 04/19/2019   CAD (coronary artery disease), native coronary artery    a. cath 02/2019 S/p DES to mLAD for 100% stenosis    CAD in native artery 02/20/2019   Elevated lipoprotein A level 04/19/2019   Glaucoma    Hyperlipidemia LDL goal <70    Hyperlipidemia with target LDL less than 70 02/12/2019   Ischemic cardiomyopathy    LV dysfunction 02/20/2019    Past Surgical History:  Procedure Laterality Date   CARDIAC CATHETERIZATION     CATARACT EXTRACTION, BILATERAL Bilateral 10/2020   Glaucoma repair   CORONARY/GRAFT ACUTE MI REVASCULARIZATION N/A 02/12/2019   Procedure: Coronary/Graft Acute MI Revascularization;  Surgeon: Marykay LexHarding, David W, MD;  [facility];  Service: Cardiovascular;  Laterality: N/A;   HAND SURGERY     right   LEFT HEART CATH AND CORONARY ANGIOGRAPHY N/A 02/12/2019   Procedure: LEFT HEART CATH AND CORONARY ANGIOGRAPHY;  Surgeon: Marykay LexHarding, David W, MD;  [facility];  Service: Cardiovascular;  Laterality: N/A;    Current Medications: Current Meds  Medication Sig   aspirin 81 MG chewable tablet Chew 1 tablet (81 mg total) by mouth daily.   atorvastatin (LIPITOR) 80 MG tablet Take 80 mg by mouth 2 (two) times a week.   Carboxymethylcellul-Glycerin (CLEAR EYES FOR DRY  EYES OP) Apply 1 drop to eye daily as needed (dry eyes).   Evolocumab (REPATHA SURECLICK) 140 MG/ML SOAJ Inject 1 pen into the skin every 14 (fourteen) days.   Ferrous Sulfate (IRON) 325 (65 Fe) MG TABS Take 1 tablet by mouth daily.   Fexofenadine HCl (ALLEGRA PO) Take 1 tablet by mouth daily.    Flaxseed, Linseed, (FLAXSEED OIL PO) Take 1 capsule by mouth daily.   fluticasone (FLONASE) 50 MCG/ACT nasal spray Place 1 spray into both  nostrils daily.   ibuprofen (ADVIL) 200 MG tablet Take 200 mg by mouth every 6 (six) hours as needed for mild pain.   metoprolol tartrate (LOPRESSOR) 25 MG tablet TAKE 1/2 TABLET (12.5 MG TOTAL) BY MOUTH 2 TIMES DAILY.   Misc Natural Products (JOINT SUPPORT COMPLEX PO) Take 1 tablet by mouth daily.   Multiple Vitamins-Minerals (MENS 50+ MULTI VITAMIN/MIN PO) Take 1 tablet by mouth daily.   nitroGLYCERIN (NITROSTAT) 0.4 MG SL tablet Place 1 tablet (0.4 mg total) under the tongue every 5 (five) minutes as needed for chest pain.   Omega-3 Fatty Acids (OMEGA-3 FISH OIL PO) Take 1 capsule by mouth daily.     Allergies:   Sulfa antibiotics   Social History   Socioeconomic History   Marital status: Married    Spouse name: Not on file   Number of children: Not on file   Years of education: Not on file   Highest education level: Not on file  Occupational History   Not on file  Tobacco Use   Smoking status: Never    Passive exposure: Never   Smokeless tobacco: Never  Vaping Use   Vaping Use: Never used  Substance and Sexual Activity   Alcohol use: No    Alcohol/week: 0.0 standard drinks   Drug use: No   Sexual activity: Not on file  Other Topics Concern   Not on file  Social History Narrative   Married   Patient is very active, he walks usually 2 to 4 miles a day, and bikes 20 to 30 miles 2 times a week.   Non-smoker   Social Determinants of Corporate investment banker Strain: Not on file  Food Insecurity: Not on file  Transportation Needs: Not on file  Physical Activity: Not on file  Stress: Not on file  Social Connections: Not on file     Family History: The patient's family history includes CAD in his paternal uncle; CAD (age of onset: 9) in his father; Diabetes in his sister; Heart Problems in his brother; Heart attack in his mother; Hypertension in his mother and sister; Parkinson's disease in his mother. ROS:   Please see the history of present illness.    All other  systems reviewed and are negative.  EKGs/Labs/Other Studies Reviewed:    The following studies were reviewed today:  EKG:  EKG ordered today and personally reviewed.  The ekg ordered today demonstrates sinus bradycardia 52 bpm otherwise normal  Recent Labs: No results found for requested labs within last 8760 hours.  Recent Lipid Panel    Component Value Date/Time   CHOL 92 (L) 02/26/2021 0850   TRIG 60 02/26/2021 0850   HDL 51 02/26/2021 0850   CHOLHDL 1.8 02/26/2021 0850   CHOLHDL 2.9 02/12/2019 1231   VLDL 24 02/12/2019 1231   LDLCALC 27 02/26/2021 0850    Physical Exam:    VS:  BP 128/64 (BP Location: Right Arm, Patient Position: Sitting)   Pulse (!) 52  Ht 5\' 7"  (1.702 m)   Wt 154 lb 12.8 oz (70.2 kg)   SpO2 98%   BMI 24.25 kg/m     Wt Readings from Last 3 Encounters:  11/27/21 154 lb 12.8 oz (70.2 kg)  08/19/21 152 lb 12.8 oz (69.3 kg)  05/22/21 148 lb (67.1 kg)     GEN:  Well nourished, well developed in no acute distress HEENT: Normal NECK: No JVD; No carotid bruits LYMPHATICS: No lymphadenopathy CARDIAC: RRR, no murmurs, rubs, gallops RESPIRATORY:  Clear to auscultation without rales, wheezing or rhonchi  ABDOMEN: Soft, non-tender, non-distended MUSCULOSKELETAL:  No edema; No deformity  SKIN: Warm and dry NEUROLOGIC:  Alert and oriented x 3 PSYCHIATRIC:  Normal affect    Signed, 13/10/22, MD  11/27/2021 9:48 AM    [facility]

## 2021-11-27 NOTE — Patient Instructions (Signed)

## 2021-12-04 VITALS — BP 129/57 | HR 57

## 2021-12-04 DIAGNOSIS — Z006 Encounter for examination for normal comparison and control in clinical research program: Secondary | ICD-10-CM

## 2021-12-04 MED ORDER — STUDY - OCEAN(A) - OLPASIRAN (AMG 890) 142 MG/ML OR PLACEBO SQ INJECTION (PI-HILTY)
142.0000 mg | PREFILLED_SYRINGE | Freq: Once | SUBCUTANEOUS | Status: AC
  Administered 2021-12-04: 142 mg via SUBCUTANEOUS
  Filled 2021-12-04: qty 1

## 2021-12-04 NOTE — Research (Signed)
Patient seen in research clinic for week 12 visit of the [name] trial. Denies any hospitalizations or adverse events since his last visit. All medications reviewed and no changes noted at this time. IP and lab work completed per protocol. Week 24 scheduled for Aug 17th.  1045: Patient returned for PK lab draw. Lab drawn at 1010.   Current Outpatient Medications:    Study - [name] - olpasiran (AMG 890) 142 mg/mL or placebo SQ injection (PI-Hilty), Inject 142 mg into the skin once. For Investigational Use Only. Inject 1 mL (1 prefilled syringe) subcutaneously into appropriate injection site per protocol every 12 weeks in clinic. (Approved injection site(s): upper arm, upper thigh & abdomen). Please contact  [facility] for Cardiology Research for any question or concerns regarding this medication., Disp: , Rfl:    aspirin 81 MG chewable tablet, Chew 1 tablet (81 mg total) by mouth daily., Disp:  , Rfl:    atorvastatin (LIPITOR) 80 MG tablet, Take 80 mg by mouth 2 (two) times a week., Disp: , Rfl:    Carboxymethylcellul-Glycerin (CLEAR EYES FOR DRY EYES OP), Apply 1 drop to eye daily as needed (dry eyes)., Disp: , Rfl:    Evolocumab (REPATHA SURECLICK) 140 MG/ML SOAJ, Inject 1 pen into the skin every 14 (fourteen) days., Disp: 2 mL, Rfl: 11   Ferrous Sulfate (IRON) 325 (65 Fe) MG TABS, Take 1 tablet by mouth daily., Disp: , Rfl:    Fexofenadine HCl (ALLEGRA PO), Take 1 tablet by mouth daily. , Disp: , Rfl:    Flaxseed, Linseed, (FLAXSEED OIL PO), Take 1 capsule by mouth daily., Disp: , Rfl:    fluticasone (FLONASE) 50 MCG/ACT nasal spray, Place 1 spray into both nostrils daily., Disp: , Rfl:    ibuprofen (ADVIL) 200 MG tablet, Take 200 mg by mouth every 6 (six) hours as needed for mild pain., Disp: , Rfl:    metoprolol tartrate (LOPRESSOR) 25 MG tablet, TAKE 1/2 TABLET (12.5 MG TOTAL) BY MOUTH 2 TIMES DAILY., Disp: 90 tablet, Rfl: 1   Misc Natural Products (JOINT SUPPORT COMPLEX PO), Take  1 tablet by mouth daily., Disp: , Rfl:    Multiple Vitamins-Minerals (MENS 50+ MULTI VITAMIN/MIN PO), Take 1 tablet by mouth daily., Disp: , Rfl:    nitroGLYCERIN (NITROSTAT) 0.4 MG SL tablet, Place 1 tablet (0.4 mg total) under the tongue every 5 (five) minutes as needed for chest pain., Disp: 28 tablet, Rfl: 1   Omega-3 Fatty Acids (OMEGA-3 FISH OIL PO), Take 1 capsule by mouth daily., Disp: , Rfl:   [name] IP ADMINISTRATION Subject ID# [id number] IP Box #: [id number] IP lot#: [id number] Time Given: 8:45 Administration site: LLQ

## 2021-12-09 NOTE — Research (Addendum)
[name] Lab results week 12     [id number]     ABNORMAL LABS: TEST RESULT  RBC 3.8  Urea 9.3         Are these clinically significant? []  YES              [x]  NO   Pixie Casino, MD, [facility], [place] Director of the Advanced Lipid Disorders &  Cardiovascular Risk Reduction Clinic Diplomate of the American Board of Clinical Lipidology Attending Cardiologist  Direct Dial: [phone]  Fax: [phone]  Website:  [email or web address]

## 2022-01-15 ENCOUNTER — Other Ambulatory Visit: Payer: Self-pay

## 2022-01-15 MED ORDER — NITROGLYCERIN 0.4 MG SL SUBL: 0.4000 mg | SUBLINGUAL_TABLET | SUBLINGUAL | 1 refills | Status: DC | PRN

## 2022-01-20 ENCOUNTER — Telehealth: Payer: Self-pay | Admitting: *Deleted

## 2022-01-20 MED ORDER — REPATHA SURECLICK 140 MG/ML ~~LOC~~ SOAJ: 1.0000 "pen " | SUBCUTANEOUS | 11 refills | Status: DC

## 2022-01-20 NOTE — Telephone Encounter (Signed)
Rx refill sent to pharmacy. 

## 2022-02-26 VITALS — BP 123/58 | HR 48 | Wt 152.0 lb

## 2022-02-26 DIAGNOSIS — Z006 Encounter for examination for normal comparison and control in clinical research program: Secondary | ICD-10-CM

## 2022-02-26 MED ORDER — STUDY - OCEAN(A) - OLPASIRAN (AMG 890) 142 MG/ML OR PLACEBO SQ INJECTION (PI-HILTY)
142.0000 mg | PREFILLED_SYRINGE | Freq: Once | SUBCUTANEOUS | Status: AC
  Administered 2022-02-26: 142 mg via SUBCUTANEOUS
  Filled 2022-02-26: qty 1

## 2022-02-26 NOTE — Research (Addendum)
Patient was seen in the research clinic today for week 24 visit of  the [facility]) trial. Dr Riley Kill completed his examination of the patient per protocol. Reviewed all concomitant medication with the patient and no changes are noted at this time. Patient denies any recent adverse events or hospitalizations since his last visit. All procedures completed per the protocol's schedule of activities. Next appointment made for Week 36 visit on Nov 9 @ 0830.  Patient returned at 0950 for PK lab draw.  [name] IP ADMINISTRATION Subject EA#[id number] IP Box [id number] IP [id number] Time [id number] Administration site: left arm   Current Outpatient Medications:    aspirin 81 MG chewable tablet, Chew 1 tablet (81 mg total) by mouth daily., Disp:  , Rfl:    atorvastatin (LIPITOR) 80 MG tablet, Take 80 mg by mouth 2 (two) times a week., Disp: , Rfl:    Carboxymethylcellul-Glycerin (CLEAR EYES FOR DRY EYES OP), Apply 1 drop to eye daily as needed (dry eyes)., Disp: , Rfl:    Evolocumab (REPATHA SURECLICK) 140 MG/ML SOAJ, Inject 1 pen  into the skin every 14 (fourteen) days., Disp: 2 mL, Rfl: 11   Ferrous Sulfate (IRON) 325 (65 Fe) MG TABS, Take 1 tablet by mouth daily., Disp: , Rfl:    Fexofenadine HCl (ALLEGRA PO), Take 1 tablet by mouth daily. , Disp: , Rfl:    Flaxseed, Linseed, (FLAXSEED OIL PO), Take 1 capsule by mouth daily., Disp: , Rfl:    fluticasone (FLONASE) 50 MCG/ACT nasal spray, Place 1 spray into both nostrils daily., Disp: , Rfl:    ibuprofen (ADVIL) 200 MG tablet, Take 200 mg by mouth every 6 (six) hours as needed for mild pain., Disp: , Rfl:    metoprolol tartrate (LOPRESSOR) 25 MG tablet, TAKE 1/2 TABLET (12.5 MG TOTAL) BY MOUTH 2 TIMES DAILY., Disp: 90 tablet, Rfl: 1   Misc Natural Products (JOINT SUPPORT COMPLEX PO), Take 1 tablet by mouth daily., Disp: , Rfl:    Multiple Vitamins-Minerals (MENS 50+ MULTI VITAMIN/MIN PO), Take 1 tablet by mouth daily., Disp: , Rfl:    nitroGLYCERIN  (NITROSTAT) 0.4 MG SL tablet, Place 1 tablet (0.4 mg total) under the tongue every 5 (five) minutes as needed for chest pain., Disp: 28 tablet, Rfl: 1   Omega-3 Fatty Acids (OMEGA-3 FISH OIL PO), Take 1 capsule by mouth daily., Disp: , Rfl:    Study - [name] - olpasiran (AMG 890) 142 mg/mL or placebo SQ injection (PI-Hilty), Inject 142 mg into the skin once. For Investigational Use Only. Inject 1 mL (1 prefilled syringe) subcutaneously into appropriate injection site per protocol every 12 weeks in clinic. (Approved injection site(s): upper arm, upper thigh & abdomen). Please contact  [facility] for Cardiology Research for any question or concerns regarding this medication., Disp: , Rfl:

## 2022-02-26 NOTE — Progress Notes (Signed)
Patient see today for follow up in study.  He is doing well.  No complaints  VS recorded Lungs clear Cor  1-2/6 SEM.  Do not appreciate an diastolic murmur.   Abdomen soft. No mass or tenderness Ext no edema  He will continue in study.  ECG SB. RBBB.  No acute changes.   Samuel [name]. Riley Kill, MD, [facility] Medical Director, [facility]

## 2022-03-02 NOTE — Research (Addendum)
[name] Week 24 lab Results   Subject [phone]       ABNORMAL LABS: TEST RESULT  Anion gap                     19  Glucose                          5.9 mmol/L 107         Are these clinically significant? []  YES              [x]  NO   , MD, [facility], FACP  [place]  [facility]  Medical Director of the Advanced Lipid Disorders &  Cardiovascular Risk Reduction Clinic Diplomate of the American Board of Clinical Lipidology Attending Cardiologist  Direct Dial: [phone]  Fax: [phone]  Website:  [email or web address]

## 2022-03-04 NOTE — Research (Signed)
[name] week 24 visit lab results subject [phone]

## 2022-03-16 ENCOUNTER — Encounter: Payer: Self-pay | Admitting: Cardiology

## 2022-03-17 ENCOUNTER — Other Ambulatory Visit: Payer: Self-pay

## 2022-03-17 ENCOUNTER — Telehealth: Payer: Self-pay

## 2022-03-17 MED ORDER — METOPROLOL TARTRATE 25 MG PO TABS: ORAL_TABLET | ORAL | 3 refills | Status: DC

## 2022-03-17 NOTE — Telephone Encounter (Signed)
Called patient and informed him that his prescription for Metoprolol had been sent in to his pharmacy in [place]. Patient was very appreciative and had no further questions at this time.

## 2022-05-21 ENCOUNTER — Encounter: Payer: Medicare PPO | Admitting: *Deleted

## 2022-05-21 VITALS — BP 117/53 | HR 57 | Temp 98.3°F

## 2022-05-21 DIAGNOSIS — Z006 Encounter for examination for normal comparison and control in clinical research program: Secondary | ICD-10-CM

## 2022-05-21 MED ORDER — STUDY - OCEAN(A) - OLPASIRAN (AMG 890) 142 MG/ML OR PLACEBO SQ INJECTION (PI-HILTY)
142.0000 mg | PREFILLED_SYRINGE | Freq: Once | SUBCUTANEOUS | Status: AC
  Administered 2022-05-21: 142 mg via SUBCUTANEOUS
  Filled 2022-05-21: qty 1

## 2022-05-21 NOTE — Research (Signed)
[name] Informed Consent   Subject Name: [name]  Subject met inclusion and exclusion criteria.  The new informed consent form, study requirements and expectations were reviewed with the subject and questions and concerns were addressed prior to the signing of the consent form.  The subject verbalized understanding of the trial requirements.  The subject agreed to participate in the [facility] trial and signed the informed consent at 8:30am on 05/21/2022.  The informed consent was obtained prior to performance of any protocol-specific procedures for the subject.  A copy of the signed informed consent was given to the subject and a copy was placed in the subject's medical record.   Samuel [name]  [employer] Consent Version 5 Protocol Version 1 amendment 3  Patient seen in clinic today for week  36 visit of [name] trial. Patient denies any adverse events since last visit. Reviewed medications with patient and no changes noted at this visit. All procedures and  Lab work completed per protocol and patient tolerated well without complaints. IP injection was given at 09:08 in the R arm without any complications noted.  Next visit Week 48 scheduled for 08/13/2021 at 08:30.  [name] IP ADMINISTRATION Subject [id number] IP Box [id number] IP [id number] Time Given:09:08 Administration site:R arm   .

## 2022-05-25 NOTE — Research (Addendum)
[name] week 36 lab results:        Are there any labs that are clinically significant?  Yes [] OR No[x]  Kenneth C. Hilty, MD, FACC, FACP  [place]  [facility]  Medical Director of the Advanced Lipid Disorders &  Cardiovascular Risk Reduction Clinic Diplomate of the American Board of Clinical Lipidology Attending Cardiologist  Direct Dial: [phone]  Fax: [phone]  Website:  [email or web address]    

## 2022-08-03 ENCOUNTER — Other Ambulatory Visit (HOSPITAL_COMMUNITY): Payer: Self-pay

## 2022-08-13 ENCOUNTER — Encounter: Payer: Medicare PPO | Admitting: *Deleted

## 2022-08-13 VITALS — BP 133/56 | HR 52 | Temp 97.4°F | Resp 16 | Wt 160.8 lb

## 2022-08-13 DIAGNOSIS — Z006 Encounter for examination for normal comparison and control in clinical research program: Secondary | ICD-10-CM

## 2022-08-13 MED ORDER — STUDY - OCEAN(A) - OLPASIRAN (AMG 890) 142 MG/ML OR PLACEBO SQ INJECTION (PI-HILTY)
142.0000 mg | PREFILLED_SYRINGE | Freq: Once | SUBCUTANEOUS | Status: AC
  Administered 2022-08-13: 142 mg via SUBCUTANEOUS
  Filled 2022-08-13: qty 1

## 2022-08-13 NOTE — Progress Notes (Signed)
Patient seen today in followup.  His cardiologist is Dr. Bettina Gavia who he will see next month.  The patient feels well and denies any chest pain.  No active symptoms.    Alert, oriented NAD BP 133/56 P 52 R 16  T 97.4 No JVD Soft bruits at base of carotids bilaterally ?transmitted murmur Lungs clear to auscultation and percussion Cor  - PMI non displaced.  Normal S1. Split S2.  3/6 SEM Abd  soft Ext no edema pulses intact Neuro is non focal.   ECG  SB.  RBBB.  Since tracing of 11/27/2021, QRS slightly longer.  RBBB.  No significant acute changes.    Ocean A Week 48, stable  Plan  Continue IP at present.   Loretha Brasil. Lia Foyer, MD Medical Director, [facility]

## 2022-08-13 NOTE — Research (Signed)
[name] week 48 visit:   Patient seen in clinic today for week  48 visit of [name] trial. Patient denies any adverse events since last visit. Reviewed medications with patient and no changes noted at this visit. All procedures and  Lab work completed per protocol and patient tolerated well without complaints. IP injection was given at 9:23 in the left arm without any complications noted.  Next visit Week 60 scheduled for 11/12/2022.   PK drawn at 10:42.          [name] IP ADMINISTRATION Subject SN#[id number] IP Box [id number] IP FXT#:[id number] Time Given:9:23 Administration site: Left arm   Current Outpatient Medications:    aspirin 81 MG chewable tablet, Chew 1 tablet (81 mg total) by mouth daily., Disp:  , Rfl:    atorvastatin (LIPITOR) 80 MG tablet, Take 80 mg by mouth 2 (two) times a week., Disp: , Rfl:    brimonidine (ALPHAGAN) 0.15 % ophthalmic solution, Place 1 drop into the right eye daily., Disp: , Rfl:    Carboxymethylcellul-Glycerin (CLEAR EYES FOR DRY EYES OP), Apply 1 drop to eye daily as needed (dry eyes)., Disp: , Rfl:    Evolocumab (REPATHA SURECLICK) 532 MG/ML SOAJ, Inject 1 pen  into the skin every 14 (fourteen) days., Disp: 2 mL, Rfl: 11   Ferrous Sulfate (IRON) 325 (65 Fe) MG TABS, Take 1 tablet by mouth daily., Disp: , Rfl:    Fexofenadine HCl (ALLEGRA PO), Take 1 tablet by mouth daily. , Disp: , Rfl:    Flaxseed, Linseed, (FLAXSEED OIL PO), Take 1 capsule by mouth daily., Disp: , Rfl:    fluticasone (FLONASE) 50 MCG/ACT nasal spray, Place 1 spray into both nostrils daily., Disp: , Rfl:    ibuprofen (ADVIL) 200 MG tablet, Take 200 mg by mouth every 6 (six) hours as needed for mild pain., Disp: , Rfl:    metoprolol tartrate (LOPRESSOR) 25 MG tablet, TAKE 1/2 TABLET (12.5 MG TOTAL) BY MOUTH 2 TIMES DAILY., Disp: 90 tablet, Rfl: 3   Misc Natural Products (JOINT SUPPORT COMPLEX PO), Take 1 tablet by mouth daily., Disp: , Rfl:    Multiple Vitamins-Minerals (MENS 50+  MULTI VITAMIN/MIN PO), Take 1 tablet by mouth daily., Disp: , Rfl:    nitroGLYCERIN (NITROSTAT) 0.4 MG SL tablet, Place 1 tablet (0.4 mg total) under the tongue every 5 (five) minutes as needed for chest pain., Disp: 28 tablet, Rfl: 1   Omega-3 Fatty Acids (OMEGA-3 FISH OIL PO), Take 1 capsule by mouth daily., Disp: , Rfl:    Study - [name] - olpasiran (AMG 890) 142 mg/mL or placebo SQ injection (PI-Hilty), Inject 142 mg into the skin once. For Investigational Use Only. Inject 1 mL (1 prefilled syringe) subcutaneously into appropriate injection site per protocol every 12 weeks in clinic. (Approved injection site(s): upper arm, upper thigh & abdomen). Please contact  [facility] for Cardiology Research for any question or concerns regarding this medication., Disp: , Rfl:   Current Facility-Administered Medications:    Study - [name] - olpasiran (AMG 890) 142 mg/mL or placebo SQ injection (PI-Hilty), 142 mg, Subcutaneous, Once, Hilty, Nadean Corwin, MD

## 2022-08-17 NOTE — Research (Cosign Needed)
[name] week 48 lab results:             Are there any labs that are clinically significant?  Yes [] OR No[x]  Kenneth C. Hilty, MD, FACC, FACP  [place]  [facility]  Medical Director of the Advanced Lipid Disorders &  Cardiovascular Risk Reduction Clinic Diplomate of the American Board of Clinical Lipidology Attending Cardiologist  Direct Dial: [phone]  Fax: [phone]  Website:  [email or web address]  

## 2022-09-07 ENCOUNTER — Ambulatory Visit: Payer: Medicare PPO | Admitting: Cardiology

## 2022-09-16 NOTE — Progress Notes (Signed)
Cardiology Office Note:    Date:  09/17/2022   ID:  Samuel [name], DOB [birth year], MRN [id number]  PCP:  Lise Auer, MD  Cardiologist:  Norman Herrlich, MD    Referring MD: Lise Auer, MD    ASSESSMENT:    1. CAD in native artery   2. Hyperlipidemia LDL goal <70   3. Elevated lipoprotein A level   4. Aortic valve insufficiency, etiology of cardiac valve disease unspecified    PLAN:    In order of problems listed above:  Continues to do well with CAD he has had no anginal discomfort following PCI follows a good lifestyle activity and continue medical therapy including aspirin lipid-lowering and beta-blocker. He is enrolled in the ocean a study with olpasiran for LP(a) and will continue his combined statin and PCSK9 inhibitor Recheck his echocardiogram to assess sensation aortic valve calcification and stenosis with excess LP(a)   Next appointment: 1 year   Medication Adjustments/Labs and Tests Ordered: Current medicines are reviewed at length with the patient today.  Concerns regarding medicines are outlined above.  No orders of the defined types were placed in this encounter.  No orders of the defined types were placed in this encounter.   Chief complaint follow-up CAD   History of Present Illness:    Samuel [name] is a 67 y.o. male with a hx of CAD with anterior ST elevation MI 03/04/2019 PCI and drug-eluting stent to the LAD initially EF reduced 45% subsequently normalized hyperlipidemia poor statin intolerance elevated LP(a) and mild aortic regurgitation last seen 11/27/2021.  Jeran using the [place] a trial for elevated lipoprotein a. He continues to do well exercises vigorously and is not having edema shortness of breath angina chest pain palpitation or syncope He continues on conventional lipid-lowering therapy including atorvastatin and Repatha Lipids are followed in the research study 1 encouraged not to change his lipid-lowering therapy Compliance with diet, lifestyle and  medications: Yes Past Medical History:  Diagnosis Date   Acute MI, anterior wall (HCC) 02/12/2019   Acute ST elevation myocardial infarction (STEMI) involving left anterior descending (LAD) coronary artery without development of Q waves (HCC) 02/12/2019   Acute ST elevation myocardial infarction (STEMI) of anterior wall (HCC) 02/12/2019   Aortic regurgitation 04/19/2019   CAD (coronary artery disease), native coronary artery    a. cath 02/2019 S/p DES to mLAD for 100% stenosis    CAD in native artery 02/20/2019   Elevated lipoprotein A level 04/19/2019   Glaucoma    Hyperlipidemia LDL goal <70    Hyperlipidemia with target LDL less than 70 02/12/2019   Ischemic cardiomyopathy    LV dysfunction 02/20/2019    Past Surgical History:  Procedure Laterality Date   CARDIAC CATHETERIZATION     CATARACT EXTRACTION, BILATERAL Bilateral 10/2020   Glaucoma repair   CORONARY/GRAFT ACUTE MI REVASCULARIZATION N/A 02/12/2019   Procedure: Coronary/Graft Acute MI Revascularization;  Surgeon: Marykay Lex, MD;  [facility] IN[facility]AS[facility] CV LAB;  Service: Cardiovascular;  Laterality: N/A;   HAND SURGERY     right   LEFT HEART CATH AND CORONARY ANGIOGRAPHY N/A 02/12/2019   Procedure: LEFT HEART CATH AND CORONARY ANGIOGRAPHY;  Surgeon: Marykay Lex, MD;  Location: [facility] INVASIVE CV LAB;  Service: Cardiovascular;  Laterality: N/A;    Current Medications: Current Meds  Medication Sig   aspirin 81 MG chewable tablet Chew 1 tablet (81 mg total) by mouth daily.   atorvastatin (LIPITOR) 80 MG tablet Take 80 mg by mouth  2 (two) times a week.   brimonidine (ALPHAGAN) 0.15 % ophthalmic solution Place 1 drop into the right eye daily.   Carboxymethylcellul-Glycerin (CLEAR EYES FOR DRY EYES OP) Apply 1 drop to eye daily as needed (dry eyes).   Evolocumab (REPATHA SURECLICK) 140 MG/ML SOAJ Inject 1 pen  into the skin every 14 (fourteen) days.   Ferrous Sulfate (IRON) 325 (65 Fe) MG TABS Take 1 tablet by mouth 3 (three)  times a week.   Fexofenadine HCl (ALLEGRA PO) Take 1 tablet by mouth daily.    Flaxseed, Linseed, (FLAXSEED OIL PO) Take 1 capsule by mouth daily.   fluticasone (FLONASE) 50 MCG/ACT nasal spray Place 1 spray into both nostrils daily.   ibuprofen (ADVIL) 200 MG tablet Take 200 mg by mouth every 6 (six) hours as needed for mild pain.   metoprolol tartrate (LOPRESSOR) 25 MG tablet TAKE 1/2 TABLET (12.5 MG TOTAL) BY MOUTH 2 TIMES DAILY.   Misc Natural Products (JOINT SUPPORT COMPLEX PO) Take 1 tablet by mouth daily.   Multiple Vitamins-Minerals (MENS 50+ MULTI VITAMIN/MIN PO) Take 1 tablet by mouth daily.   nitroGLYCERIN (NITROSTAT) 0.4 MG SL tablet Place 1 tablet (0.4 mg total) under the tongue every 5 (five) minutes as needed for chest pain.   NON FORMULARY Take 25 mg by mouth daily. CBD tablets   Omega-3 Fatty Acids (OMEGA-3 FISH OIL PO) Take 1 capsule by mouth daily.   Study - [name] - olpasiran (AMG 890) 142 mg/mL or placebo SQ injection (PI-Hilty) Inject 142 mg into the skin once. For Investigational Use Only. Inject 1 mL (1 prefilled syringe) subcutaneously into appropriate injection site per protocol every 12 weeks in clinic. (Approved injection site(s): upper arm, upper thigh & abdomen). Please contact  [facility] for any question or concerns regarding this medication.     Allergies:   Sulfa antibiotics   Social History   Socioeconomic History   Marital status: Married    Spouse name: Not on file   Number of children: Not on file   Years of education: Not on file   Highest education level: Not on file  Occupational History   Not on file  Tobacco Use   Smoking status: Never    Passive exposure: Never   Smokeless tobacco: Never  Vaping Use   Vaping Use: Never used  Substance and Sexual Activity   Alcohol use: No    Alcohol/week: 0.0 standard drinks of alcohol   Drug use: No   Sexual activity: Not on file  Other Topics Concern   Not on file   Social History Narrative   Married   Patient is very active, he walks usually 2 to 4 miles a day, and bikes 20 to 30 miles 2 times a week.   Non-smoker   Social Determinants of Corporate investment banker Strain: Not on file  Food Insecurity: Not on file  Transportation Needs: Not on file  Physical Activity: Not on file  Stress: Not on file  Social Connections: Not on file     Family History: The patient's family history includes CAD in his paternal uncle; CAD (age of onset: 17) in his father; Diabetes in his sister; Heart Problems in his brother; Heart attack in his mother; Hypertension in his mother and sister; Parkinson's disease in his mother. ROS:   Please see the history of present illness.    All other systems reviewed and are negative.  EKGs/Labs/Other Studies Reviewed:    The  following studies were reviewed today:  Cardiac Studies & Procedures   CARDIAC CATHETERIZATION  CARDIAC CATHETERIZATION 02/12/2019  Narrative Images from the original result were not included.   Culprit lesion: Mid LAD lesion is 100% stenosed.  A drug-eluting stent was successfully placed using a STENT RESOLUTE ONYX [id number].--Postdilated to 3.0 mm  Post intervention, there is a 0% residual stenosis.  ------------------------------------  1st Diag lesion is 50% stenosed.  Prox RCA lesion is 30% stenosed.  Mid Cx lesion is 20% stenosed with 40% stenosed side branch in LPAV.  LPAV lesion is 40% stenosed with 40% stenosed side branch in 1st LPL.  ------------------------------------  There is mild left ventricular systolic dysfunction. The left ventricular ejection fraction is 35-45% by visual estimate.  LV end diastolic pressure is mildly elevated.  SUMMARY  Severe single-vessel disease of 100% occlusion of the mid LAD just after first diagonal branch.  Successful DES PCI with Resolute Onyx 2.75 mm x 18 mm (postdilated to 3.0 mm)  Mildly reduced LVEF with anterior and inferior  apical hypokinesis.  Mildly elevated LVEDP.  RECOMMENDATIONS  Admit to CCU for ongoing care TR band removal.  Anticipate possible fast-track discharge.  Consider checking 2D echo prior to discharge  High intensity high-dose statin started  Low-dose beta-blocker started  Brilinta given in Cath Lab, will run [place] for additional 2 hours    Bryan Lemma, M.D., M.S. Interventional Cardiologist  Pager # [phone] Phone # [phone] [address] [place], [place] [address]  Findings Coronary Findings Diagnostic  Dominance: Right  Left Anterior Descending Mid LAD lesion is 100% stenosed. Vessel is the culprit lesion. The lesion is distal to major branch and thrombotic.  First Diagonal Branch Vessel is small in size. 1st Diag lesion is 50% stenosed. The lesion is discrete.  First Septal Branch Vessel is small in size.  Second Diagonal Branch Vessel is small in size.  Second Septal Branch Vessel is small in size.  Left Circumflex Mid Cx lesion is 20% stenosed with 40% stenosed side branch in LPAV.  First Obtuse Marginal Branch Vessel is small in size.  Second Obtuse Marginal Branch Vessel is small in size.  Left Posterior Atrioventricular Artery LPAV lesion is 40% stenosed with 40% stenosed side branch in 1st LPL.  Right Coronary Artery There is mild diffuse disease throughout the vessel. There is a very proximal left atrial branch that has appearance of a potential anomalous circumflex however does not reach the full circumflex territory. Prox RCA lesion is 30% stenosed. The lesion is eccentric.  Acute Marginal Branch Vessel is small in size.  Right Ventricular Branch Vessel is small in size.  Right Posterior Atrioventricular Artery Vessel is moderate in size  First Right Posterolateral Branch Vessel is small in size.  Second Right Posterolateral Branch Vessel is small in size.  Intervention  Mid LAD lesion Stent Lesion length:   18 mm. CATH VISTA GUIDE 6FR XBLAD3.5 guide catheter was inserted. Lesion crossed with guidewire using a WIRE ASAHI PROWATER 180CM. Pre-stent angioplasty was performed using a BALLOON SAPPHIRE 2.5X15. Maximum pressure:  8 atm. Inflation time:  20 sec. A drug-eluting stent was successfully placed using a STENT RESOLUTE ONYX [id number]. Maximum pressure: 14 atm. Inflation time: 30 sec. Minimum lumen area:  3 mm. Stent strut is well apposed. Post-stent angioplasty was performed. Maximum pressure:  20 atm. Inflation time:  20 sec. Stent balloon, high ATM Slight stepdown after stent but otherwise smooth transition Post-Intervention Lesion Assessment The intervention was successful. Pre-interventional TIMI flow  is 0. Post-intervention TIMI flow is 3. Treated lesion length:  18 mm. No complications occurred at this lesion. There is a 0% residual stenosis post intervention.     ECHOCARDIOGRAM  ECHOCARDIOGRAM COMPLETE 03/13/2019  Narrative ECHOCARDIOGRAM REPORT    Patient Name:   Alvar Simon   Date of Exam: 03/13/2019 Medical Rec #:  [id number]  Height:       67.0 in Accession #:    [id number] Weight:       154.0 lb Date of Birth:  [birth year]  BSA:          1.81 m Patient Age:    63 years   BP:           120/64 mmHg Patient Gender: M          HR:           64 bpm. Exam Location:  [facility]   Procedure: 2D Echo  Indications:    LV dysfunction [I51.9]  History:        Patient has prior history of Echocardiogram examinations. CAD Risk Factors: Dyslipidemia.  Sonographer:    Louie Boston Referring Phys: [phone] Nohlan Burdin J Joice Nazario  IMPRESSIONS   1. The left ventricle has normal systolic function with an ejection fraction of 60-65%. The cavity size was normal. Left ventricular diastolic Doppler parameters are consistent with impaired relaxation. 2. The right ventricle has normal systolic function. The cavity was normal. There is no increase in right ventricular wall thickness. 3. The mitral valve is grossly  normal. 4. The aortic valve is abnormal. Moderate sclerosis of the aortic valve. Aortic valve regurgitation is mild by color flow Doppler. 5. The aorta is normal unless otherwise noted.  FINDINGS Left Ventricle: The left ventricle has normal systolic function, with an ejection fraction of 60-65%. The cavity size was normal. There is borderline increase in left ventricular wall thickness. Left ventricular diastolic Doppler parameters are consistent with impaired relaxation.  Right Ventricle: The right ventricle has normal systolic function. The cavity was normal. There is no increase in right ventricular wall thickness.  Left Atrium: Left atrial size was normal in size.  Right Atrium: Right atrial size was normal in size. Right atrial pressure is estimated at 3 mmHg.  Interatrial Septum: No atrial level shunt detected by color flow Doppler.  Pericardium: There is no evidence of pericardial effusion.  Mitral Valve: The mitral valve is grossly normal. Mitral valve regurgitation was not assessed by color flow Doppler.  Tricuspid Valve: The tricuspid valve is normal in structure. Tricuspid valve regurgitation is trivial by color flow Doppler.  Aortic Valve: The aortic valve is abnormal Moderate sclerosis of the aortic valve. Aortic valve regurgitation is mild by color flow Doppler.  Pulmonic Valve: The pulmonic valve was not well visualized. Pulmonic valve regurgitation was not assessed by color flow Doppler.  Aorta: The aorta is normal unless otherwise noted.  Venous: The inferior vena cava measures 1.50 cm, is normal in size with greater than 50% respiratory variability.   +--------------+--------++ LEFT VENTRICLE              +----------------+---------++ +--------------+--------++      Diastology                PLAX 2D                     +----------------+---------++ +--------------+--------++      LV e' lateral:  7.83 cm/s LVIDd:        5.00 cm        +----------------+---------++ +--------------+--------++  LV E/e' lateral:5.6       LVIDs:        2.90 cm       +----------------+---------++ +--------------+--------++      LV e' medial:   5.77 cm/s LV PW:        1.20 cm       +----------------+---------++ +--------------+--------++      LV E/e' medial: 7.6       LV IVS:       1.20 cm       +----------------+---------++ +--------------+--------++ LVOT diam:    2.00 cm  +--------------+--------++ LV SV:        86 ml    +--------------+--------++ LV SV Index:  47.15    +--------------+--------++ LVOT Area:    3.14 cm +--------------+--------++                        +--------------+--------++  +------------------+---------++ LV Volumes (MOD)            +------------------+---------++ LV area d, A2C:   26.80 cm +------------------+---------++ LV area d, A4C:   29.40 cm +------------------+---------++ LV area s, A2C:   15.80 cm +------------------+---------++ LV area s, A4C:   15.90 cm +------------------+---------++ LV major d, A2C:  7.61 cm   +------------------+---------++ LV major d, A4C:  8.42 cm   +------------------+---------++ LV major s, A2C:  6.61 cm   +------------------+---------++ LV major s, A4C:  6.88 cm   +------------------+---------++ LV vol d, MOD A2C:77.7 ml   +------------------+---------++ LV vol d, MOD A4C:84.2 ml   +------------------+---------++ LV vol s, MOD A2C:33.1 ml   +------------------+---------++ LV vol s, MOD A4C:30.6 ml   +------------------+---------++ LV SV MOD A2C:    44.6 ml   +------------------+---------++ LV SV MOD A4C:    84.2 ml   +------------------+---------++ LV SV MOD BP:     52.9 ml   +------------------+---------++  +---------------+-------++-----------++ LEFT ATRIUM           Index       +---------------+-------++-----------++ LA diam:        3.60 cm1.99 cm/m  +---------------+-------++-----------++ LA Vol (A2C):  41.3 ml22.82 ml/m +---------------+-------++-----------++ LA Vol (A4C):  45.1 ml24.92 ml/m +---------------+-------++-----------++ LA Biplane Vol:45.3 ml25.03 ml/m +---------------+-------++-----------++ +------------+---------++-----------++ RIGHT ATRIUM         Index       +------------+---------++-----------++ RA Area:    13.50 cm            +------------+---------++-----------++ RA Volume:  33.10 ml 18.29 ml/m +------------+---------++-----------++ +------------------+------------++ AORTIC VALVE                   +------------------+------------++ AV Area (Vmax):   1.77 cm     +------------------+------------++ AV Area (Vmean):  1.52 cm     +------------------+------------++ AV Area (VTI):    1.72 cm     +------------------+------------++ AV Vmax:          221.50 cm/s  +------------------+------------++ AV Vmean:         149.500 cm/s +------------------+------------++ AV VTI:           0.400 m      +------------------+------------++ AV Peak Grad:     19.6 mmHg    +------------------+------------++ AV Mean Grad:     10.5 mmHg    +------------------+------------++ LVOT Vmax:        125.00 cm/s  +------------------+------------++ LVOT Vmean:       72.400 cm/s  +------------------+------------++ LVOT VTI:         0.219 m      +------------------+------------++  LVOT/AV VTI ratio:0.55         +------------------+------------++ AR PHT:           577 msec     +------------------+------------++  +-------------+-------++ AORTA                +-------------+-------++ Ao Root diam:3.00 cm +-------------+-------++ Ao Asc diam: 3.10 cm +-------------+-------++  +--------------+----------++ +---------------+-----------++ MITRAL VALVE             TRICUSPID VALVE             +--------------+----------++ +---------------+-----------++ MV Area (PHT):3.85 cm   TR Peak grad:  21.2 mmHg   +--------------+----------++ +---------------+-----------++ MV PHT:       57.13 msec TR Vmax:       230.00 cm/s +--------------+----------++ +---------------+-----------++ MV Decel Time:197 msec   +--------------+----------++ +--------------+-------+ +--------------+----------++ SHUNTS                MV E velocity:44.10 cm/s +--------------+-------+ +--------------+----------++ Systemic VTI: 0.22 m  MV A velocity:64.50 cm/s +--------------+-------+ +--------------+----------++ Systemic Diam:2.00 cm MV E/A ratio: 0.68       +--------------+-------+ +--------------+----------++  +---------+-------+ IVC              +---------+-------+ IVC diam:1.50 cm +---------+-------+   Belva Crome MD Electronically signed by Belva Crome MD Signature Date/Time: 03/13/2019/4:39:46 PM    Final             Recent Labs: No results found for requested labs within last 365 days.  Recent Lipid Panel    Component Value Date/Time   CHOL 92 (L) 02/26/2021 0850   TRIG 60 02/26/2021 0850   HDL 51 02/26/2021 0850   CHOLHDL 1.8 02/26/2021 0850   CHOLHDL 2.9 02/12/2019 1231   VLDL 24 02/12/2019 1231   LDLCALC 27 02/26/2021 0850    Physical Exam:    VS:  BP 122/66 (BP Location: Left Arm, Patient Position: Sitting)   Pulse 62   Ht 5\' 7"  (1.702 m)   Wt 158 lb (71.7 kg)   SpO2 97%   BMI 24.75 kg/m     Wt Readings from Last 3 Encounters:  09/17/22 158 lb (71.7 kg)  08/13/22 160 lb 12.8 oz (72.9 kg)  02/26/22 152 lb (68.9 kg)     GEN:  Well nourished, well developed in no acute distress HEENT: Normal NECK: No JVD; No carotid bruits LYMPHATICS: No lymphadenopathy CARDIAC: RRR, he has a grade 1/6 to 2/6 aortic outflow murmur RESPIRATORY:  Clear to auscultation without rales, wheezing or rhonchi  ABDOMEN: Soft, non-tender,  non-distended MUSCULOSKELETAL:  No edema; No deformity  SKIN: Warm and dry NEUROLOGIC:  Alert and oriented x 3 PSYCHIATRIC:  Normal affect    Signed, Norman Herrlich, MD  09/17/2022 12:47 PM    [facility]

## 2022-09-17 ENCOUNTER — Ambulatory Visit: Payer: Medicare PPO | Attending: Cardiology | Admitting: Cardiology

## 2022-09-17 ENCOUNTER — Encounter: Payer: Self-pay | Admitting: Cardiology

## 2022-09-17 VITALS — BP 122/66 | HR 62 | Ht 67.0 in | Wt 158.0 lb

## 2022-09-17 DIAGNOSIS — E7841 Elevated Lipoprotein(a): Secondary | ICD-10-CM

## 2022-09-17 DIAGNOSIS — E785 Hyperlipidemia, unspecified: Secondary | ICD-10-CM | POA: Diagnosis not present

## 2022-09-17 DIAGNOSIS — I2510 Atherosclerotic heart disease of native coronary artery without angina pectoris: Secondary | ICD-10-CM | POA: Diagnosis not present

## 2022-09-17 DIAGNOSIS — I351 Nonrheumatic aortic (valve) insufficiency: Secondary | ICD-10-CM

## 2022-09-17 NOTE — Patient Instructions (Signed)
Medication Instructions:  Your physician recommends that you continue on your current medications as directed. Please refer to the Current Medication list given to you today.  *If you need a refill on your cardiac medications before your next appointment, please call your pharmacy*   Lab Work: None If you have labs (blood work) drawn today and your tests are completely normal, you will receive your results only by: [place] (if you have MyChart) OR A paper copy in the mail If you have any lab test that is abnormal or we need to change your treatment, we will call you to review the results.   Testing/Procedures: Your physician has requested that you have an echocardiogram. Echocardiography is a painless test that uses sound waves to create images of your heart. It provides your doctor with information about the size and shape of your heart and how well your heart's chambers and valves are working. This procedure takes approximately one hour. There are no restrictions for this procedure. Please do NOT wear cologne, perfume, aftershave, or lotions (deodorant is allowed). Please arrive 15 minutes prior to your appointment time.    Follow-Up: At [facility], you and your health needs are our priority.  As part of our continuing mission to provide you with exceptional heart care, we have created designated Provider Care Teams.  These Care Teams include your primary Cardiologist (physician) and Advanced Practice Providers (APPs -  Physician Assistants and Nurse Practitioners) who all work together to provide you with the care you need, when you need it.  We recommend signing up for the patient portal called "MyChart".  Sign up information is provided on this After Visit Summary.  MyChart is used to connect with patients for Virtual Visits (Telemedicine).  Patients are able to view lab/test results, encounter notes, upcoming appointments, etc.  Non-urgent messages can be sent to your  provider as well.   To learn more about what you can do with MyChart, go to [email or web address].    Your next appointment:   1 year(s)  Provider:   Shirlee More, MD    Other Instructions None

## 2022-09-28 ENCOUNTER — Other Ambulatory Visit: Payer: Self-pay

## 2022-09-28 ENCOUNTER — Ambulatory Visit: Payer: Medicare PPO | Attending: Cardiology

## 2022-09-28 DIAGNOSIS — I351 Nonrheumatic aortic (valve) insufficiency: Secondary | ICD-10-CM

## 2022-09-28 DIAGNOSIS — I2510 Atherosclerotic heart disease of native coronary artery without angina pectoris: Secondary | ICD-10-CM | POA: Diagnosis not present

## 2022-09-28 DIAGNOSIS — E7841 Elevated Lipoprotein(a): Secondary | ICD-10-CM | POA: Diagnosis not present

## 2022-09-28 DIAGNOSIS — E785 Hyperlipidemia, unspecified: Secondary | ICD-10-CM | POA: Diagnosis not present

## 2022-09-28 LAB — ECHOCARDIOGRAM COMPLETE
AR max vel: 1.35 cm2
AV Area VTI: 1.46 cm2
AV Area mean vel: 1.37 cm2
AV Mean grad: 13.3 mmHg
AV Peak grad: 25.6 mmHg
Ao pk vel: 2.53 m/s
P 1/2 time: 540 msec
S' Lateral: 3.1 cm

## 2022-10-21 ENCOUNTER — Encounter: Payer: Self-pay | Admitting: Cardiology

## 2022-10-21 ENCOUNTER — Other Ambulatory Visit: Payer: Self-pay

## 2022-10-21 MED ORDER — ATORVASTATIN CALCIUM 80 MG PO TABS: 80.0000 mg | ORAL_TABLET | ORAL | 3 refills | Status: DC

## 2022-10-28 ENCOUNTER — Encounter: Payer: Self-pay | Admitting: Cardiology

## 2022-11-10 ENCOUNTER — Telehealth: Payer: Self-pay | Admitting: Cardiology

## 2022-11-10 NOTE — Telephone Encounter (Signed)
Pt c/o medication issue:  1. Name of Medication:  atorvastatin (LIPITOR) 80 MG tablet  2. How are you currently taking this medication (dosage and times per day)?   3. Are you having a reaction (difficulty breathing--STAT)?   4. What is your medication issue?   Monica called in on behalf of [address] She states the [employer] of Cardiology and the American Heart Association recommend for patients who are 23-67 years old to take a high intensity statin. She would like a call back to discuss changing patient's medication.

## 2022-11-12 VITALS — BP 127/54 | HR 58 | Temp 97.3°F

## 2022-11-12 DIAGNOSIS — Z006 Encounter for examination for normal comparison and control in clinical research program: Secondary | ICD-10-CM

## 2022-11-12 MED ORDER — STUDY - OCEAN(A) - OLPASIRAN (AMG 890) 142 MG/ML OR PLACEBO SQ INJECTION (PI-HILTY)
142.0000 mg | PREFILLED_SYRINGE | Freq: Once | SUBCUTANEOUS | Status: AC
  Administered 2022-11-12: 142 mg via SUBCUTANEOUS
  Filled 2022-11-12: qty 1

## 2022-11-12 NOTE — Research (Addendum)
[name] week 60  Patient seen in clinic today for week  60 visit of [name] trial. Patient denies any adverse events since last visit. Reviewed medications with patient and no changes noted at this visit. All procedures completed per protocol and patient tolerated well without complaints. IP injection was given at 09:10 in the L  arm without any complications noted. No labs due at this visit.  Next visit Week 72 scheduled for 02/04/23.    [name] IP ADMINISTRATION Subject RU#[id number] IP Box [id number] IP YNW#:[id number] Time Given:09:10 Administration site:L arm   Current Outpatient Medications:    aspirin 81 MG chewable tablet, Chew 1 tablet (81 mg total) by mouth daily., Disp:  , Rfl:    atorvastatin (LIPITOR) 80 MG tablet, Take 1 tablet (80 mg total) by mouth 2 (two) times a week., Disp: 30 tablet, Rfl: 3   brimonidine (ALPHAGAN) 0.15 % ophthalmic solution, Place 1 drop into the right eye daily., Disp: , Rfl:    Carboxymethylcellul-Glycerin (CLEAR EYES FOR DRY EYES OP), Apply 1 drop to eye daily as needed (dry eyes)., Disp: , Rfl:    Evolocumab (REPATHA SURECLICK) 140 MG/ML SOAJ, Inject 1 pen  into the skin every 14 (fourteen) days., Disp: 2 mL, Rfl: 11   Ferrous Sulfate (IRON) 325 (65 Fe) MG TABS, Take 1 tablet by mouth 3 (three) times a week., Disp: , Rfl:    Fexofenadine HCl (ALLEGRA PO), Take 1 tablet by mouth daily. , Disp: , Rfl:    Flaxseed, Linseed, (FLAXSEED OIL PO), Take 1 capsule by mouth daily., Disp: , Rfl:    fluticasone (FLONASE) 50 MCG/ACT nasal spray, Place 1 spray into both nostrils daily., Disp: , Rfl:    ibuprofen (ADVIL) 200 MG tablet, Take 200 mg by mouth every 6 (six) hours as needed for mild pain., Disp: , Rfl:    metoprolol tartrate (LOPRESSOR) 25 MG tablet, TAKE 1/2 TABLET (12.5 MG TOTAL) BY MOUTH 2 TIMES DAILY., Disp: 90 tablet, Rfl: 3   Misc Natural Products (JOINT SUPPORT COMPLEX PO), Take 1 tablet by mouth daily., Disp: , Rfl:    Multiple  Vitamins-Minerals (MENS 50+ MULTI VITAMIN/MIN PO), Take 1 tablet by mouth daily., Disp: , Rfl:    nitroGLYCERIN (NITROSTAT) 0.4 MG SL tablet, Place 1 tablet (0.4 mg total) under the tongue every 5 (five) minutes as needed for chest pain., Disp: 28 tablet, Rfl: 1   NON FORMULARY, Take 25 mg by mouth daily. CBD tablets, Disp: , Rfl:    Omega-3 Fatty Acids (OMEGA-3 FISH OIL PO), Take 1 capsule by mouth daily., Disp: , Rfl:    Study - [name] - olpasiran (AMG 890) 142 mg/mL or placebo SQ injection (PI-Hilty), Inject 142 mg into the skin once. For Investigational Use Only. Inject 1 mL (1 prefilled syringe) subcutaneously into appropriate injection site per protocol every 12 weeks in clinic. (Approved injection site(s): upper arm, upper thigh & abdomen). Please contact  [facility] for any question or concerns regarding this medication., Disp: , Rfl:   Current Facility-Administered Medications:    Study - [name] - olpasiran (AMG 890) 142 mg/mL or placebo SQ injection (PI-Hilty), 142 mg, Subcutaneous, Once, Hilty, Lisette Abu, MD

## 2022-12-18 ENCOUNTER — Encounter: Payer: Self-pay | Admitting: Cardiology

## 2022-12-18 ENCOUNTER — Other Ambulatory Visit: Payer: Self-pay

## 2022-12-18 MED ORDER — REPATHA SURECLICK 140 MG/ML ~~LOC~~ SOAJ: 140.0000 mg | SUBCUTANEOUS | 3 refills | Status: DC

## 2023-01-05 DIAGNOSIS — H21233 Degeneration of iris (pigmentary), bilateral: Secondary | ICD-10-CM | POA: Diagnosis not present

## 2023-01-05 DIAGNOSIS — H401332 Pigmentary glaucoma, bilateral, moderate stage: Secondary | ICD-10-CM | POA: Diagnosis not present

## 2023-01-05 DIAGNOSIS — Z961 Presence of intraocular lens: Secondary | ICD-10-CM | POA: Diagnosis not present

## 2023-01-05 DIAGNOSIS — H26492 Other secondary cataract, left eye: Secondary | ICD-10-CM | POA: Diagnosis not present

## 2023-01-29 MED ORDER — STUDY - OCEAN(A) - OLPASIRAN (AMG 890) 142 MG/ML OR PLACEBO SQ INJECTION (PI-HILTY)
142.0000 mg | PREFILLED_SYRINGE | Freq: Once | SUBCUTANEOUS | Status: AC
  Administered 2023-02-04: 142 mg via SUBCUTANEOUS
  Filled 2023-01-29: qty 1

## 2023-02-02 DIAGNOSIS — H401332 Pigmentary glaucoma, bilateral, moderate stage: Secondary | ICD-10-CM | POA: Diagnosis not present

## 2023-02-02 DIAGNOSIS — Z961 Presence of intraocular lens: Secondary | ICD-10-CM | POA: Diagnosis not present

## 2023-02-02 DIAGNOSIS — H21233 Degeneration of iris (pigmentary), bilateral: Secondary | ICD-10-CM | POA: Diagnosis not present

## 2023-02-02 DIAGNOSIS — H26492 Other secondary cataract, left eye: Secondary | ICD-10-CM | POA: Diagnosis not present

## 2023-02-04 VITALS — BP 124/48 | HR 58 | Temp 98.2°F

## 2023-02-04 DIAGNOSIS — Z006 Encounter for examination for normal comparison and control in clinical research program: Secondary | ICD-10-CM

## 2023-02-04 NOTE — Research (Signed)
[name] week 72  Patient seen in clinic today for week  72 visit of [name] trial. Patient denies any adverse events since last visit. Reviewed medications with patient and no changes noted at this visit. All procedures and  Lab work completed per protocol and patient tolerated well without complaints. IP injection was given at 09:07 in the L arm without any complications noted.  Next visit Week 84 scheduled for 04/22/2023.  Reviewed lipid blinding with the patient and he/she understands.   [name] IP ADMINISTRATION Subject MV#[id number] IP Box [id number] IP UXL#:[id number] Time Given:09:07 Administration site:L arm    Current Outpatient Medications:    aspirin 81 MG chewable tablet, Chew 1 tablet (81 mg total) by mouth daily., Disp:  , Rfl:    atorvastatin (LIPITOR) 80 MG tablet, Take 1 tablet (80 mg total) by mouth 2 (two) times a week., Disp: 30 tablet, Rfl: 3   brimonidine (ALPHAGAN) 0.15 % ophthalmic solution, Place 1 drop into the right eye daily., Disp: , Rfl:    Carboxymethylcellul-Glycerin (CLEAR EYES FOR DRY EYES OP), Apply 1 drop to eye daily as needed (dry eyes)., Disp: , Rfl:    Evolocumab (REPATHA SURECLICK) 140 MG/ML SOAJ, Inject 140 mg into the skin every 14 (fourteen) days., Disp: 6 mL, Rfl: 3   Ferrous Sulfate (IRON) 325 (65 Fe) MG TABS, Take 1 tablet by mouth 3 (three) times a week., Disp: , Rfl:    Fexofenadine HCl (ALLEGRA PO), Take 1 tablet by mouth daily. , Disp: , Rfl:    Flaxseed, Linseed, (FLAXSEED OIL PO), Take 1 capsule by mouth daily., Disp: , Rfl:    fluticasone (FLONASE) 50 MCG/ACT nasal spray, Place 1 spray into both nostrils daily., Disp: , Rfl:    ibuprofen (ADVIL) 200 MG tablet, Take 200 mg by mouth every 6 (six) hours as needed for mild pain., Disp: , Rfl:    metoprolol tartrate (LOPRESSOR) 25 MG tablet, TAKE 1/2 TABLET (12.5 MG TOTAL) BY MOUTH 2 TIMES DAILY., Disp: 90 tablet, Rfl: 3   Misc Natural Products (JOINT SUPPORT COMPLEX PO), Take 1 tablet by  mouth daily., Disp: , Rfl:    Multiple Vitamins-Minerals (MENS 50+ MULTI VITAMIN/MIN PO), Take 1 tablet by mouth daily., Disp: , Rfl:    nitroGLYCERIN (NITROSTAT) 0.4 MG SL tablet, Place 1 tablet (0.4 mg total) under the tongue every 5 (five) minutes as needed for chest pain., Disp: 28 tablet, Rfl: 1   NON FORMULARY, Take 25 mg by mouth daily. CBD tablets, Disp: , Rfl:    Omega-3 Fatty Acids (OMEGA-3 FISH OIL PO), Take 1 capsule by mouth daily., Disp: , Rfl:    Study - [name] - olpasiran (AMG 890) 142 mg/mL or placebo SQ injection (PI-Hilty), Inject 142 mg into the skin once. For Investigational Use Only. Inject 1 mL (1 prefilled syringe) subcutaneously into appropriate injection site per protocol every 12 weeks in clinic. (Approved injection site(s): upper arm, upper thigh & abdomen). Please contact  [facility] for any question or concerns regarding this medication., Disp: , Rfl:   Current Facility-Administered Medications:    Study - [name] - olpasiran (AMG 890) 142 mg/mL or placebo SQ injection (PI-Hilty), 142 mg, Subcutaneous, Once,

## 2023-02-08 NOTE — Research (Addendum)
Are there any labs that are clinically significant?  Yes []  OR No[x]  Only ones in red is abnormal values.    [name], MD, [facility], FACP  [place]  [facility]  Medical Director of the [facility]  Cardiovascular Risk Reduction Clinic Diplomate of the American Board of Clinical Lipidology Attending Cardiologist  Direct Dial: [phone]  Fax: [phone]  Website:  [email or web address]                                           ACCESSION NO. [id number]                                             Page 1 of 1                                                        INVESTIGATOR: ([id number])                          PROTOCOL   [id number]                     Samuel [name] M.D.                             INVESTIGATOR NO.: [id number]                     c/o Claretta Fraise                              SUBJECT ID: [id number]                     [facility]                 SUBJECT INITIALS NOT COLLECTED:                     [address] [place] [id number]                 VISIT: [name], [place] [id number]                   SPONSOR REPORT TO:                 COLLECTION TIME:08:34 DATE:04-Feb-2023                     Mertha Baars                      DATE RECEIVED IN LABORATORY: 05-Feb-2023                     c/o Sponsor(or Addtl) [facility].Study DATE REPORTED BY LABORATORY: 08-Feb-2023  [facility]                          SEX: M  BIRTHDATE:  11-Jan-1956    AGE: 13Y                     [id number]                  Wallis Bamberg NO.: [phone]                   [place], [place] [address]                                                                                        Ref. Ranges               Clinical    Comments                                                                          Significance                                                                            Yes*  No                     [employer]                      APTT           23.6         21.9-29.4 sec                                PT             11.1         9.7-12.3 sec                                 INR            1.1          Patient not taking                                                       oral anticoagulant:  0.8 - 1.2                                                                Patient taking                                                          oral anticoagulant:                                                      2.0 - 3.0                                  ALT & INR                      ALT & INR      Criteria not met                                        AST & INR                      AST & INR      Criteria not met                   SM [id number] ANTIBODY COLL D/T                      CDate PreD     04-Feb-2023                                               CTime PreD     08:34                                 ALT > 3 X ULN                      ALT>3XULN      Criteria not met                                        ALT > 5 X ULN                      ALT>5XULN      Criteria not met  ALT > 8 X ULN                      ALT>8XULN      Criteria not met                                        ALT & TBIL                      ALT & TBIL     Criteria not met                                        AST > 3 X ULN                      AST>3XULN      Criteria not met                                        AST > 5 X ULN                      AST>5XULN      Criteria not met                                        AST > 8 X ULN                      AST>8XULN      Criteria not met                                        AST & TBIL                      AST & TBIL     Criteria not met                                        EGFR                      CKDEPI eGF     85           mL/min/1.70m2                                                             No Ref Rng     CHEMISTRY PANEL                      Total Bili     0.4          0.2-1.2 mg/dL  Dir Bili       0.2          0.0-0.4 mg/dL                                Ind Bili       0.2          0.0-1.2 mg/dL                                Alk Phos       57           40-129 U/L                                   ALT (SGPT)     18           5-48 U/L                                    AST (SGOT)     22           8-40 U/L                                    Urea Nitr      20           4-24 mg/dL                                   Creatinine     0.98         0.45-1.35 mg/dL                              Calcium        9.3          8.3-10.6 mg/dL                              Total Prot     6.7          6.0-8.0 g/dL                                 Alb BCG        4.3          3.3-4.9 g/dL                                 CK             89           39-308 U/L                                   Sodium         143          135-145 mEq/L  Potassium      3.9          3.5-5.2 mEq/L                                Bicarb         23.6         19.3-29.3 mEq/L                              Chloride       106          94-112 mEq/L                               ADJUSTED CALCIUM                      Adj Calc       9.3          8.3-10.6 mg/dL     HEMATOLOGY&DIFFERENTIAL PANEL                      HGB            12.6         12.5-17.0 g/dL                              HCT            37           37-51 %                                      RBC            3.8     L    4.0-5.8 x106/uL                              MCH            33           26-34 pg                                    MCHC           34           31-38 g/dL                                   RDW            12.7         12.0-15.0 %                                 RBC Morph      No Review Required  MCV            97            80-100 fL                                    WBC            5.14         3.80-10.70 x103/uL                           Neutrophil     2.74         1.96-7.23 x103/uL                           Lymphocyte     1.84         0.80-3.00 x103/uL                           Monocytes      0.33         0.12-0.92 x103/uL                           Eosinophil     0.20         0.00-0.57 x103/uL                           Basophils      0.03         0.00-0.20 x103/uL                           Neutrophil     53.4         40.5-75.0 %                                 Lymphocyte     35.9         15.4-48.5%                                   Monocytes      6.4          2.6-10.1 %                                   Eosinophil     3.9          0.0-6.8 %                                    Basophils      0.5          0.0-2.0 %                                    Platelets      194          130-394 x103/uL  ANC                      ANC            2.74         1.96-7.23 x103/u   ANION GAP                      Anion Gap      17           7-18 mEq/L

## 2023-02-10 DIAGNOSIS — L600 Ingrowing nail: Secondary | ICD-10-CM | POA: Diagnosis not present

## 2023-02-24 DIAGNOSIS — M79675 Pain in left toe(s): Secondary | ICD-10-CM | POA: Diagnosis not present

## 2023-02-24 DIAGNOSIS — Z9889 Other specified postprocedural states: Secondary | ICD-10-CM | POA: Diagnosis not present

## 2023-02-24 DIAGNOSIS — B351 Tinea unguium: Secondary | ICD-10-CM | POA: Diagnosis not present

## 2023-03-06 ENCOUNTER — Other Ambulatory Visit: Payer: Self-pay | Admitting: Cardiology

## 2023-04-22 ENCOUNTER — Encounter: Payer: Medicare PPO | Admitting: *Deleted

## 2023-04-22 DIAGNOSIS — Z006 Encounter for examination for normal comparison and control in clinical research program: Secondary | ICD-10-CM

## 2023-04-22 MED ORDER — STUDY - OCEAN(A) - OLPASIRAN (AMG 890) 142 MG/ML OR PLACEBO SQ INJECTION (PI-HILTY)
142.0000 mg | PREFILLED_SYRINGE | Freq: Once | SUBCUTANEOUS | Status: AC
  Administered 2023-04-22: 142 mg via SUBCUTANEOUS
  Filled 2023-04-22: qty 1

## 2023-04-22 NOTE — Research (Signed)
Ocean A  Week 84  Vitals: [x]  Experience any AE/SAE/Hospitalizations []  Yes [x]  No  If yes please explain:  Labs collected:  no labs  Discussed with patient about the importance of not letting any one draw cholesterol or lipids. As to we are blinded to results. Reassured patient if we needed to be contacted the study team would reach out to our unblinded person.   ~reminder fasting labs, EKG, MD to see next visit ~  Non-Fatal Potential Endpoint Assessment Yes  No   Has the subject experienced/undergone any of the following since the last visit/contact?   []   [x]    Any Coronary Artery Revascularization/Cerebrovascular Revascularization/ Peripheral Artery Revascularization/Amputation Procedure   []   [x]    Myocardial Infarction []   [x]    Stroke   []   [x]    Provide the date for the non-fatal Potential Endpoints status:   []   [x]     IP admin please see MAR (please add Box # to comment section on MAR) [x]    Current Outpatient Medications:    aspirin 81 MG chewable tablet, Chew 1 tablet (81 mg total) by mouth daily., Disp:  , Rfl:    atorvastatin (LIPITOR) 80 MG tablet, Take 1 tablet (80 mg total) by mouth 2 (two) times a week., Disp: 30 tablet, Rfl: 3   brimonidine (ALPHAGAN) 0.15 % ophthalmic solution, Place 1 drop into the right eye daily., Disp: , Rfl:    Carboxymethylcellul-Glycerin (CLEAR EYES FOR DRY EYES OP), Apply 1 drop to eye daily as needed (dry eyes)., Disp: , Rfl:    Evolocumab (REPATHA SURECLICK) 140 MG/ML SOAJ, Inject 140 mg into the skin every 14 (fourteen) days., Disp: 6 mL, Rfl: 3   Ferrous Sulfate (IRON) 325 (65 Fe) MG TABS, Take 1 tablet by mouth 3 (three) times a week., Disp: , Rfl:    Fexofenadine HCl (ALLEGRA PO), Take 1 tablet by mouth daily. , Disp: , Rfl:    Flaxseed, Linseed, (FLAXSEED OIL PO), Take 1 capsule by mouth daily., Disp: , Rfl:    fluticasone (FLONASE) 50 MCG/ACT nasal spray, Place 1 spray into both nostrils daily., Disp: , Rfl:    ibuprofen (ADVIL)  200 MG tablet, Take 200 mg by mouth every 6 (six) hours as needed for mild pain., Disp: , Rfl:    metoprolol tartrate (LOPRESSOR) 25 MG tablet, Take 0.5 tablets (12.5 mg total) by mouth 2 (two) times daily., Disp: 90 tablet, Rfl: 1   Misc Natural Products (JOINT SUPPORT COMPLEX PO), Take 1 tablet by mouth daily., Disp: , Rfl:    Multiple Vitamins-Minerals (MENS 50+ MULTI VITAMIN/MIN PO), Take 1 tablet by mouth daily., Disp: , Rfl:    nitroGLYCERIN (NITROSTAT) 0.4 MG SL tablet, Place 1 tablet (0.4 mg total) under the tongue every 5 (five) minutes as needed for chest pain., Disp: 28 tablet, Rfl: 1   NON FORMULARY, Take 25 mg by mouth daily. CBD tablets, Disp: , Rfl:    Omega-3 Fatty Acids (OMEGA-3 FISH OIL PO), Take 1 capsule by mouth daily., Disp: , Rfl:    Study - [name] - olpasiran (AMG 890) 142 mg/mL or placebo SQ injection (PI-Hilty), Inject 142 mg into the skin once. For Investigational Use Only. Inject 1 mL (1 prefilled syringe) subcutaneously into appropriate injection site per protocol every 12 weeks in clinic. (Approved injection site(s): upper arm, upper thigh & abdomen). Please contact  [facility] Research for any question or concerns regarding this medication., Disp: , Rfl:    timolol (TIMOPTIC) 0.25 %  ophthalmic solution, Place 1 drop into both eyes 2 (two) times daily., Disp: , Rfl:   Current Facility-Administered Medications:    Study - [name] - olpasiran (AMG 890) 142 mg/mL or placebo SQ injection (PI-Hilty), 142 mg, Subcutaneous, Once,

## 2023-06-16 ENCOUNTER — Other Ambulatory Visit: Payer: Self-pay | Admitting: Cardiology

## 2023-07-15 ENCOUNTER — Encounter: Payer: Medicare PPO | Admitting: *Deleted

## 2023-07-15 DIAGNOSIS — Z006 Encounter for examination for normal comparison and control in clinical research program: Secondary | ICD-10-CM

## 2023-07-15 MED ORDER — STUDY - OCEAN(A) - OLPASIRAN (AMG 890) 142 MG/ML OR PLACEBO SQ INJECTION (PI-HILTY)
142.0000 mg | PREFILLED_SYRINGE | Freq: Once | SUBCUTANEOUS | Status: AC
  Administered 2023-07-15: 142 mg via SUBCUTANEOUS
  Filled 2023-07-15: qty 1

## 2023-07-15 NOTE — Research (Signed)
 Samuel [name]  Week 96  Vitals: [x]  Experience any AE/SAE/Hospitalizations []  Yes [x]  No  If yes please explain:  EKG: [x]  MD to see: TS Labs collected:  0905  Discussed with patient about the importance of not letting any one draw cholesterol or lipids. As to we are blinded to results. Reassured patient if we needed to be contacted the study team would reach out to our unblinded person.   No labs at next visit 108.    Non-Fatal Potential Endpoint Assessment Yes  No   Has the subject experienced/undergone any of the following since the last visit/contact?   []   [x]    Any Coronary Artery Revascularization/Cerebrovascular Revascularization/ Peripheral Artery Revascularization/Amputation Procedure   []   [x]    Myocardial Infarction []   [x]    Stroke   []   [x]    Provide the date for the non-fatal Potential Endpoints status:   []   [x]     IP admin please see MAR (please add Box # to comment section on MAR) [x]    Current Outpatient Medications:    aspirin  81 MG chewable tablet, Chew 1 tablet (81 mg total) by mouth daily., Disp:  , Rfl:    atorvastatin  (LIPITOR ) 80 MG tablet, Take 1 tablet (80 mg total) by mouth 2 (two) times [name] week., Disp: 30 tablet, Rfl: 3   brimonidine (ALPHAGAN) 0.15 % ophthalmic solution, Place 1 drop into both eyes daily., Disp: , Rfl:    Carboxymethylcellul-Glycerin (CLEAR EYES FOR DRY EYES OP), Apply 1 drop to eye daily as needed (dry eyes)., Disp: , Rfl:    Evolocumab  (REPATHA  SURECLICK) 140 MG/ML SOAJ, Inject 140 mg into the skin every 14 (fourteen) days., Disp: 6 mL, Rfl: 3   Ferrous Sulfate (IRON) 325 (65 Fe) MG TABS, Take 1 tablet by mouth 3 (three) times [name] week., Disp: , Rfl:    Fexofenadine HCl (ALLEGRA PO), Take 1 tablet by mouth daily. , Disp: , Rfl:    Flaxseed, Linseed, (FLAXSEED OIL PO), Take 1 capsule by mouth daily., Disp: , Rfl:    fluticasone  (FLONASE ) 50 MCG/ACT nasal spray, Place 1 spray into both nostrils daily., Disp: , Rfl:    ibuprofen (ADVIL)  200 MG tablet, Take 200 mg by mouth every 6 (six) hours as needed for mild pain., Disp: , Rfl:    metoprolol  tartrate (LOPRESSOR ) 25 MG tablet, Take 0.5 tablets (12.5 mg total) by mouth 2 (two) times daily., Disp: 90 tablet, Rfl: 1   Misc Natural Products (JOINT SUPPORT COMPLEX PO), Take 1 tablet by mouth daily., Disp: , Rfl:    Multiple Vitamins-Minerals (MENS 50+ MULTI VITAMIN/MIN PO), Take 1 tablet by mouth daily., Disp: , Rfl:    nitroGLYCERIN  (NITROSTAT ) 0.4 MG SL tablet, Place 1 tablet (0.4 mg total) under the tongue every 5 (five) minutes as needed for chest pain., Disp: 25 tablet, Rfl: 2   NON FORMULARY, Take 25 mg by mouth daily. CBD tablets, Disp: , Rfl:    Omega-3 Fatty Acids (OMEGA-3 FISH OIL PO), Take 1 capsule by mouth daily., Disp: , Rfl:    Study - Samuel([name]) - olpasiran (AMG 890) 142 mg/mL or placebo SQ injection (PI-Hilty), Inject 142 mg into the skin once. For Investigational Use Only. Inject 1 mL (1 prefilled syringe) subcutaneously into appropriate injection site per protocol every 12 weeks in clinic. (Approved injection site(s): upper arm, upper thigh & abdomen). Please contact  [facility] Research for any question or concerns regarding this medication., Disp: , Rfl:    timolol (  TIMOPTIC) 0.25 % ophthalmic solution, Place 1 drop into both eyes 2 (two) times daily., Disp: , Rfl:

## 2023-07-15 NOTE — Progress Notes (Signed)
 Mr. Kerschner is seen in follow up today. He continues to do well with no recurrent events.  He has no obvious side effects from IP.  We again reviewed all of the issues related to the trial.  He plans to continue in the trial and with follow up, and we discussed timelines.  He has mild AS/AI, and he is scheduled for follow up and echocardiography with Dr. Monetta in the spring.    BP 130/65  BP Location Right Arm  Patient Position Sitting  Cuff Size Normal  Pulse Rate 54Pulse Rate. 54. Data is abnormal. Taken on 07/15/23 8:29 AM  Temp 97.8 F (36.6 C)  Weight 149 lb 6.4 oz (67.8 kg)  Resp 18  SpO2 100 %   Alert, oriented gentleman in NAD No JVD Lungs clear to A and P Cor  2.5/6 SEM at LSE with radiation to the base of the carotids.  No definite carotid bruits. Abdomen - soft without obvious masses Ext  no edema Neuro - grossly intact.  ECG -  SB.  RBBB.  No change from prior tracing of 08/13/2022.    Impression   Elevated Lpa in [place] a trial - stable  CAD sp PCI of the mid LAD  Mild/moderate AS with mild AI   Plan   Continue in trial at present on IP   Continued follow up with Dr. Monetta for CAD, Lpa, and aortic valve disease  Debby BIRCH. Morris, MD, [facility] Medical Director, [facility]

## 2023-07-19 NOTE — Research (Addendum)
 Are there any labs that are clinically significant?  Yes []  OR No[x]   Please FORWARD back to me with any changes or follow up!   Samuel KYM Maxcy, MD, [facility], FACP  [place]  [name]  Medical Director of the Advanced Lipid Disorders &  Cardiovascular Risk Reduction Clinic Diplomate of the American Board of Clinical Lipidology Attending Cardiologist  Direct Dial: [phone]  Fax: [phone]  Website:  [email or web address]   ACCESSION NO. [id number]                                             Page 1 of 1                                                        INVESTIGATOR: ([id number])                          PROTOCOL   [id number]                     Samuel [name] M.D.                             INVESTIGATOR NO.: [id number]                     c/o Samuel [name]                              SUBJECT ID: [id number]                     Samuel [name]                 SUBJECT INITIALS NOT COLLECTED:                     [address] [place] [id number]                 VISIT: Samuel [name], Samuel [name]  [id number]                   SPONSOR REPORT TO:                 COLLECTION TIME:09:05 DATE:15-Jul-2023                     Samuel [name]                      DATE RECEIVED IN LABORATORY: 16-Jul-2023                     c/o Sponsor(or Addtl) [facility].Study DATE REPORTED BY LABORATORY: 17-Jul-2023                     [facility]                          SEX: M  BIRTHDATE:  11-Jan-1956    AGE: 67y                     [address]                  SELMA NO.: [phone]                   [place], [place] [name]  [phone]                                                                                        Ref. Ranges               Clinical    Comments                                                                          Significance                                                                            Yes*  No                    ALT > 3 X ULN                       ALT>3XULN      Criteria not met                                        ALT > 5 X ULN                      ALT>5XULN      Criteria not met                                        ALT > 8 X ULN                      ALT>8XULN      Criteria not met                                        ALT & TBIL  ALT & TBIL     Criteria not met                                        AST > 3 X ULN                      AST>3XULN      Criteria not met                                        AST > 5 X ULN                      AST>5XULN      Criteria not met                                        AST > 8 X ULN                      AST>8XULN      Criteria not met                                        AST & TBIL                      AST & TBIL     Criteria not met                      SM [id number] ANTIBODY COLL D/T                      CDate PreD     Awaiting specimen arrival                                 CTime PreD     Awaiting specimen arrival                               [name] LPA COLLECTION D/T                      Coll Date      15-Jul-2023                                               Coll Time      09:05                        CO[facility] - See Note #1                      APTT           Awaiting specimen arrival  PT             Awaiting specimen arrival                                 INR            Awaiting specimen arrival                               ALT & INR - See Note #1                      ALT & INR      Awaiting specimen arrival                               AST & INR - See Note #1                      AST & INR      Awaiting specimen arrival     Note #1 - Specimen not submitted; please ship or testing will be canceled.  HBA1C                      Hgb A1c        6.0          <6.5%                       EGFR                      CKDEPI eGF     89           mL/min/1.6m2                                                             No Ref Rng     CHEMISTRY PANEL                      Total Bili     0.5          0.2-1.2 mg/dL                                Dir Bili       0.2          0.0-0.4 mg/dL                                Ind Bili       0.3          0.0-1.2 mg/dL                                Alk Phos       60           40-129 U/L  ALT (SGPT)     25           5-48 U/L                                    AST (SGOT)     26           8-40 U/L                                    Urea Nitr      18           4-24 mg/dL                                   Creatinine     0.94         0.45-1.35 mg/dL                              Calcium         9.6          8.3-10.6 mg/dL                              Total Prot     7.3          6.0-8.0 g/dL                                 Alb BCG        4.6          3.3-4.9 g/dL                                 CK             107          39-308 U/L                                   Sodium         139          135-145 mEq/L                                Potassium      4.7          3.5-5.2 mEq/L                                Bicarb         23.8         19.3-29.3 mEq/L                              Chloride       102          94-112 mEq/L  ADJUSTED CALCIUM                       Adj Calc       9.6          8.3-10.6 mg/dL      HEMATOLOGY&DIFFERENTIAL PANEL                   #1 HGB            13.9         12.5-17.0 g/dL                           #1 HCT            38           37-51 %                                   #1 RBC            4.0          4.0-5.8 x106/uL                           #1 MCH            35      H    26-34 pg          (33 = 02/04/2023)                       #1 MCHC           37           31-38 g/dL                                #1 RDW            12.6         12.0-15.0 %                                 RBC Morph      No Review Required                                    #1 MCV             95           80-100 fL                                 #1 WBC            7.44         3.80-10.70 x103/uL                        #1 Neutrophil     4.68         1.96-7.23 x103/uL                        #1 Lymphocyte     1.97  0.80-3.00 x103/uL                        #1 Monocytes      0.51         0.12-0.92 x103/uL                        #1 Eosinophil     0.17         0.00-0.57 x103/uL                        #1 Basophils      0.09         0.00-0.20 x103/uL                        #1 Neutrophil     63.0         40.5-75.0 %                              #1 Lymphocyte     26.5         15.4-48.5%                                #1 Monocytes      6.9          2.6-10.1 %                                #1 Eosinophil     2.3          0.0-6.8 %                                 #1 Basophils      1.3          0.0-2.0 %                                 #1 Platelets      216          130-394 x103/uL                            ANC                      ANC            4.68         1.96-7.23 x103/uL    Note #1 - Result verified by repeat analysis  ANION GAP                      Anion Gap      18           7-18 mEq/L  FASTING GLUCOSE                      Glucose        101     H    70-100 mg/dL        (898 = 97/98/7975)  IS SUBJECT FASTING?                      Fasting?       Yes   COAGULATION GROUP                      APTT           22.6         21.9-29.4 sec                                PT             11.0         9.7-12.3 sec                                 INR            1.0          Patient not taking                                                       oral anticoagulant:                                                      0.8 - 1.2                                                                Patient taking                                                          oral anticoagulant:                                                      2.0 - 3.0                                   ALT & INR                      ALT & INR      Unable to calculate                                     AST & INR  AST & INR      Unable to calculate   SM [id number] ANTIBODY COLL D/T                      CDate PreD     15-Jul-2023                                               CTime PreD     09:05

## 2023-07-20 ENCOUNTER — Encounter: Payer: Self-pay | Admitting: Cardiology

## 2023-07-20 DIAGNOSIS — M545 Low back pain, unspecified: Secondary | ICD-10-CM | POA: Diagnosis not present

## 2023-08-03 ENCOUNTER — Ambulatory Visit: Payer: Medicare PPO | Attending: Cardiology

## 2023-08-03 ENCOUNTER — Encounter: Payer: Self-pay | Admitting: Cardiology

## 2023-08-03 DIAGNOSIS — I351 Nonrheumatic aortic (valve) insufficiency: Secondary | ICD-10-CM

## 2023-08-03 LAB — ECHOCARDIOGRAM COMPLETE
AR max vel: 1.23 cm2
AV Area VTI: 1.36 cm2
AV Area mean vel: 1.24 cm2
AV Mean grad: 13.3 mm[Hg]
AV Peak grad: 25.4 mm[Hg]
Ao pk vel: 2.52 m/s
P 1/2 time: 547 ms
S' Lateral: 2.5 cm

## 2023-08-04 DIAGNOSIS — M5441 Lumbago with sciatica, right side: Secondary | ICD-10-CM | POA: Diagnosis not present

## 2023-08-12 DIAGNOSIS — H01009 Unspecified blepharitis unspecified eye, unspecified eyelid: Secondary | ICD-10-CM | POA: Diagnosis not present

## 2023-08-12 DIAGNOSIS — Z961 Presence of intraocular lens: Secondary | ICD-10-CM | POA: Diagnosis not present

## 2023-08-12 DIAGNOSIS — H26492 Other secondary cataract, left eye: Secondary | ICD-10-CM | POA: Diagnosis not present

## 2023-08-12 DIAGNOSIS — H401332 Pigmentary glaucoma, bilateral, moderate stage: Secondary | ICD-10-CM | POA: Diagnosis not present

## 2023-08-12 DIAGNOSIS — H21233 Degeneration of iris (pigmentary), bilateral: Secondary | ICD-10-CM | POA: Diagnosis not present

## 2023-08-18 NOTE — Research (Addendum)
 Are there any labs that are clinically significant?  Yes []  OR No[x]   Please FORWARD back to me with any changes or follow up!   [name], MD, [facility], FACP  [place]  [facility]  Medical Director of the Advanced Lipid Disorders &  Cardiovascular Risk Reduction Clinic Diplomate of the American Board of Clinical Lipidology Attending Cardiologist  Direct Dial: [phone]  Fax: [phone]  Website:  [email or web address]

## 2023-08-27 NOTE — Research (Signed)
Called patient to remind him to take his cholesterol medication as prescribed, exercise, and heart healthy diet.  Lipids were checked but blinded so this is just a friendly reminder so LDL will remain with goal.  Mercer Pod :) RN BSN  Clinical Research Nurse  Be strong and take heart, all you who hope in the Lord. ~ Psalm 31:24

## 2023-08-31 DIAGNOSIS — M545 Low back pain, unspecified: Secondary | ICD-10-CM | POA: Diagnosis not present

## 2023-09-11 ENCOUNTER — Other Ambulatory Visit: Payer: Self-pay | Admitting: Cardiology

## 2023-09-21 NOTE — Progress Notes (Unsigned)
 Cardiology Office Note:    Date:  09/22/2023   ID:  Samuel [name], DOB May 13, 1956, MRN [id number]  PCP:  Lise Auer, MD  Cardiologist:  Norman Herrlich, MD    Referring MD: Lise Auer, MD    ASSESSMENT:    1. CAD in native artery   2. Hyperlipidemia LDL goal <70   3. Elevated lipoprotein A level   4. Aortic valve insufficiency, etiology of cardiac valve disease unspecified    PLAN:    In order of problems listed above:  Samuel [name] continues to do well with CAD following his PCI and stent August 2020 having no anginal discomfort he will continue his current medical therapy including low-dose aspirin beta-blocker and lipid-lowering with Repatha and study drug. He is due for labs in [facility] He has mild aortic regurgitation I do not think he requires repeat echocardiogram at this time no murmur on exam If palpitation becomes problematic he will notify the office and we will place an event monitor I encouraged him to wear a smart watch and see if he can capture these episodes for heart rate   Next appointment: 1 year follow-up   Medication Adjustments/Labs and Tests Ordered: Current medicines are reviewed at length with the patient today.  Concerns regarding medicines are outlined above.  Orders Placed This Encounter  Procedures   EKG 12-Lead   No orders of the defined types were placed in this encounter.    History of Present Illness:    Samuel [name] is a 68 y.o. male with a hx of coronary artery disease with anterior ST elevation MI August 2020 PCI drug-eluting stent to the LAD initially reduced ejection fraction hyperlipidemia poor statin intolerant aortic regurgitation last seen 09/17/2022.  Compliance with diet, lifestyle and medications: Yes  From a cardiology perspective he is doing well.  Rarely he will be aware of his heart he says it is almost like being anxious not really rapid it is not sustained and he is never caught it with his smart watch Blood  pressure remains in the range usually 105-125/67 heart rates run 60 to 70 bpm Remains in the [facility] study is due for lab work in 2 weeks He is having no muscle pain or weakness X her size on a regular basis and has had no angina edema shortness of breath or syncope Past Medical History:  Diagnosis Date   Acute MI, anterior wall (HCC) 02/12/2019   Acute ST elevation myocardial infarction (STEMI) involving left anterior descending (LAD) coronary artery without development of Q waves (HCC) 02/12/2019   Acute ST elevation myocardial infarction (STEMI) of anterior wall (HCC) 02/12/2019   Aortic regurgitation 04/19/2019   CAD (coronary artery disease), native coronary artery    a. cath 02/2019 S/p DES to mLAD for 100% stenosis    CAD in native artery 02/20/2019   Elevated lipoprotein A level 04/19/2019   Glaucoma    Hyperlipidemia LDL goal <70    Hyperlipidemia with target LDL less than 70 02/12/2019   Ischemic cardiomyopathy    LV dysfunction 02/20/2019    Current Medications: Current Meds  Medication Sig   aspirin 81 MG chewable tablet Chew 1 tablet (81 mg total) by mouth daily.   atorvastatin (LIPITOR) 80 MG tablet Take 1 tablet (80 mg total) by mouth 2 (two) times a week.   Carboxymethylcellul-Glycerin (CLEAR EYES FOR DRY EYES OP) Apply 1 drop to eye daily as needed (dry eyes).   diclofenac (VOLTAREN)  75 MG EC tablet Take 75 mg by mouth as needed for mild pain (pain score 1-3).   Evolocumab (REPATHA SURECLICK) 140 MG/ML SOAJ Inject 140 mg into the skin every 14 (fourteen) days.   Ferrous Sulfate (IRON) 325 (65 Fe) MG TABS Take 1 tablet by mouth 3 (three) times a week.   Fexofenadine HCl (ALLEGRA PO) Take 1 tablet by mouth daily.    Flaxseed, Linseed, (FLAXSEED OIL PO) Take 1 capsule by mouth daily.   fluticasone (FLONASE) 50 MCG/ACT nasal spray Place 1 spray into both nostrils daily.   ibuprofen (ADVIL) 200 MG tablet Take 200 mg by mouth every 6 (six) hours as needed for mild pain.   metoprolol  tartrate (LOPRESSOR) 25 MG tablet Take 0.5 tablets (12.5 mg total) by mouth 2 (two) times daily. Patient must keep appointment on 09/22/23 for further refills. 1 st attempt   Misc Natural Products (JOINT SUPPORT COMPLEX PO) Take 1 tablet by mouth daily.   Multiple Vitamins-Minerals (MENS 50+ MULTI VITAMIN/MIN PO) Take 1 tablet by mouth daily.   nitroGLYCERIN (NITROSTAT) 0.4 MG SL tablet Place 1 tablet (0.4 mg total) under the tongue every 5 (five) minutes as needed for chest pain.   NON FORMULARY Take 25 mg by mouth daily. CBD tablets   Omega-3 Fatty Acids (OMEGA-3 FISH OIL PO) Take 1 capsule by mouth daily.   Study - [name] - olpasiran (AMG 890) 142 mg/mL or placebo SQ injection (PI-Hilty) Inject 142 mg into the skin once. For Investigational Use Only. Inject 1 mL (1 prefilled syringe) subcutaneously into appropriate injection site per protocol every 12 weeks in clinic. (Approved injection site(s): upper arm, upper thigh & abdomen). Please contact  [facility] for Cardiology Research for any question or concerns regarding this medication.   timolol (TIMOPTIC) 0.25 % ophthalmic solution Place 1 drop into both eyes 2 (two) times daily.    EKG Interpretation Date/Time:  Wednesday September 22 2023 12:49:18 EDT Ventricular Rate:  60 PR Interval:  148 QRS Duration:  146 QT Interval:  442 QTC Calculation: 442 R Axis:   47  Text Interpretation: Normal sinus rhythm Right bundle branch block When compared with ECG of 12-Feb-2019 14:10, Right bundle branch block is now Present Confirmed by Norman Herrlich ([id number]) on 09/22/2023 12:52:16 PM    EKGs/Labs/Other Studies Reviewed:    The following studies were reviewed today:     EKG Interpretation Date/Time:  Wednesday September 22 2023 12:49:18 EDT Ventricular Rate:  60 PR Interval:  148 QRS Duration:  146 QT Interval:  442 QTC Calculation: 442 R Axis:   47  Text Interpretation: Normal sinus rhythm Right bundle branch block When compared with  ECG of 12-Feb-2019 14:10, Right bundle branch block is now Present Confirmed by Norman Herrlich ([id number]) on 09/22/2023 12:52:16 PM   Recent Labs: No results found for requested labs within last 365 days.  Recent Lipid Panel    Component Value Date/Time   CHOL 92 (L) 02/26/2021 0850   TRIG 60 02/26/2021 0850   HDL 51 02/26/2021 0850   CHOLHDL 1.8 02/26/2021 0850   CHOLHDL 2.9 02/12/2019 1231   VLDL 24 02/12/2019 1231   LDLCALC 27 02/26/2021 0850    Physical Exam:    VS:  BP 132/78   Pulse 60   Ht 5\' 7"  (1.702 m)   Wt 148 lb 6.4 oz (67.3 kg)   SpO2 98%   BMI 23.24 kg/m     Wt Readings from Last 3 Encounters:  09/22/23 148 lb  6.4 oz (67.3 kg)  07/15/23 149 lb 6.4 oz (67.8 kg)  09/17/22 158 lb (71.7 kg)     GEN:  Well nourished, well developed in no acute distress HEENT: Normal NECK: No JVD; No carotid bruits LYMPHATICS: No lymphadenopathy CARDIAC: RRR, no murmurs, rubs, gallops RESPIRATORY:  Clear to auscultation without rales, wheezing or rhonchi  ABDOMEN: Soft, non-tender, non-distended MUSCULOSKELETAL:  No edema; No deformity  SKIN: Warm and dry NEUROLOGIC:  Alert and oriented x 3 PSYCHIATRIC:  Normal affect    Signed, Norman Herrlich, MD  09/22/2023 1:06 PM    [facility]

## 2023-09-22 ENCOUNTER — Encounter: Payer: Self-pay | Admitting: Cardiology

## 2023-09-22 ENCOUNTER — Ambulatory Visit: Payer: Medicare PPO | Attending: Cardiology | Admitting: Cardiology

## 2023-09-22 VITALS — BP 132/78 | HR 60 | Ht 67.0 in | Wt 148.4 lb

## 2023-09-22 DIAGNOSIS — E7841 Elevated Lipoprotein(a): Secondary | ICD-10-CM

## 2023-09-22 DIAGNOSIS — E785 Hyperlipidemia, unspecified: Secondary | ICD-10-CM

## 2023-09-22 DIAGNOSIS — I2510 Atherosclerotic heart disease of native coronary artery without angina pectoris: Secondary | ICD-10-CM | POA: Diagnosis not present

## 2023-09-22 DIAGNOSIS — I351 Nonrheumatic aortic (valve) insufficiency: Secondary | ICD-10-CM

## 2023-09-22 MED ORDER — METOPROLOL TARTRATE 25 MG PO TABS: 12.5000 mg | ORAL_TABLET | Freq: Two times a day (BID) | ORAL | 3 refills | Status: AC

## 2023-09-22 MED ORDER — ATORVASTATIN CALCIUM 80 MG PO TABS: 80.0000 mg | ORAL_TABLET | ORAL | 3 refills | Status: AC

## 2023-09-22 NOTE — Patient Instructions (Signed)

## 2023-10-07 ENCOUNTER — Encounter: Payer: Medicare PPO | Admitting: *Deleted

## 2023-10-07 DIAGNOSIS — Z006 Encounter for examination for normal comparison and control in clinical research program: Secondary | ICD-10-CM

## 2023-10-07 MED ORDER — STUDY - OCEAN(A) - OLPASIRAN (AMG 890) 142 MG/ML OR PLACEBO SQ INJECTION (PI-HILTY)
142.0000 mg | PREFILLED_SYRINGE | Freq: Once | SUBCUTANEOUS | Status: AC
Start: 2023-10-07 — End: 2023-10-07
  Administered 2023-10-07: 142 mg via SUBCUTANEOUS
  Filled 2023-10-07: qty 1

## 2023-10-07 NOTE — Research (Signed)
 Samuel Samuel [name]  Week 108  Vitals: [x]  Experience any AE/SAE/Hospitalizations []  Yes [x]  No  If yes please explain:  Labs collected:  no labs  Discussed with patient about the importance of not letting any one draw cholesterol or lipids. As to we are blinded to results. Reassured patient if we needed to be contacted the study team would reach out to our unblinded person.   ~reminder labs next visit 120 ~  Non-Fatal Potential Endpoint Assessment Yes  No   Has the subject experienced/undergone any of the following since the last visit/contact?   []   [x]    Any Coronary Artery Revascularization/Cerebrovascular Revascularization/ Peripheral Artery Revascularization/Amputation Procedure   []   [x]    Myocardial Infarction []   [x]    Stroke   []   [x]    Provide the date for the non-fatal Potential Endpoints status:   []   [x]     IP admin please see MAR (please add Box # to comment section on MAR) [x]    Current Outpatient Medications:    aspirin 81 MG chewable tablet, Chew 1 tablet (81 mg total) by mouth daily., Disp:  , Rfl:    atorvastatin (LIPITOR) 80 MG tablet, Take 1 tablet (80 mg total) by mouth 2 (two) times Samuel [name] week., Disp: 25 tablet, Rfl: 3   Carboxymethylcellul-Glycerin (CLEAR EYES FOR DRY EYES OP), Apply 1 drop to eye daily as needed (dry eyes)., Disp: , Rfl:    diclofenac (VOLTAREN) 75 MG EC tablet, Take 75 mg by mouth as needed for mild pain (pain score 1-3)., Disp: , Rfl:    Evolocumab (REPATHA SURECLICK) 140 MG/ML SOAJ, Inject 140 mg into the skin every 14 (fourteen) days., Disp: 6 mL, Rfl: 3   Ferrous Sulfate (IRON) 325 (65 Fe) MG TABS, Take 1 tablet by mouth 3 (three) times Samuel [name] week., Disp: , Rfl:    Fexofenadine HCl (ALLEGRA PO), Take 1 tablet by mouth daily. , Disp: , Rfl:    Flaxseed, Linseed, (FLAXSEED OIL PO), Take 1 capsule by mouth daily., Disp: , Rfl:    fluticasone (FLONASE) 50 MCG/ACT nasal spray, Place 1 spray into both nostrils daily., Disp: , Rfl:    ibuprofen (ADVIL) 200 MG  tablet, Take 200 mg by mouth every 6 (six) hours as needed for mild pain., Disp: , Rfl:    metoprolol tartrate (LOPRESSOR) 25 MG tablet, Take 0.5 tablets (12.5 mg total) by mouth 2 (two) times daily., Disp: 90 tablet, Rfl: 3   Misc Natural Products (JOINT SUPPORT COMPLEX PO), Take 1 tablet by mouth daily., Disp: , Rfl:    Multiple Vitamins-Minerals (MENS 50+ MULTI VITAMIN/MIN PO), Take 1 tablet by mouth daily., Disp: , Rfl:    nitroGLYCERIN (NITROSTAT) 0.4 MG SL tablet, Place 1 tablet (0.4 mg total) under the tongue every 5 (five) minutes as needed for chest pain., Disp: 25 tablet, Rfl: 2   NON FORMULARY, Take 25 mg by mouth daily. CBD tablets, Disp: , Rfl:    Omega-3 Fatty Acids (OMEGA-3 FISH OIL PO), Take 1 capsule by mouth daily., Disp: , Rfl:    Study - Samuel(Samuel [name]) - olpasiran (AMG 890) 142 mg/mL or placebo SQ injection (PI-Hilty), Inject 142 mg into the skin once. For Investigational Use Only. Inject 1 mL (1 prefilled syringe) subcutaneously into appropriate injection site per protocol every 12 weeks in clinic. (Approved injection site(s): upper arm, upper thigh & abdomen). Please contact  [facility] for Cardiology Research for any question or concerns regarding this medication., Disp: , Rfl:    timolol (TIMOPTIC)  0.25 % ophthalmic solution, Place 1 drop into both eyes 2 (two) times daily., Disp: , Rfl:   Current Facility-Administered Medications:    Study - Samuel(Samuel [name]) - olpasiran (AMG 890) 142 mg/mL or placebo SQ injection (PI-Hilty), 142 mg, Subcutaneous, Once,

## 2023-12-04 ENCOUNTER — Other Ambulatory Visit: Payer: Self-pay | Admitting: Cardiology

## 2023-12-30 ENCOUNTER — Other Ambulatory Visit: Payer: Self-pay

## 2023-12-30 ENCOUNTER — Encounter: Admitting: *Deleted

## 2023-12-30 DIAGNOSIS — Z006 Encounter for examination for normal comparison and control in clinical research program: Secondary | ICD-10-CM

## 2023-12-30 MED ORDER — STUDY - OCEAN(A) - OLPASIRAN (AMG 890) 142 MG/ML OR PLACEBO SQ INJECTION (PI-HILTY)
142.0000 mg | PREFILLED_SYRINGE | Freq: Once | SUBCUTANEOUS | Status: AC
  Administered 2023-12-30: 142 mg via SUBCUTANEOUS
  Filled 2023-12-30: qty 1

## 2023-12-30 NOTE — Research (Signed)
 [facility]  Week 120  Vitals: [x]  Med reviews: [x]  Labs: (week 24): [x]   [phone] Experience any AE/SAE/Hospitalizations []  Yes [x]  No  If yes please explain:   Discussed with patient about the importance of not letting any one draw cholesterol or lipids. As to we are blinded to results. Reassured patient if we needed to be contacted the study team would reach out to our unblinded person.   ~no labs next visit 132 ~  Non-Fatal Potential Endpoint Assessment Yes  No   Has the subject experienced/undergone any of the following since the last visit/contact?   []   [x]    Any Coronary Artery Revascularization/Cerebrovascular Revascularization/ Peripheral Artery Revascularization/Amputation Procedure   []   [x]    Myocardial Infarction []   [x]    Stroke   []   [x]    Provide the date for the non-fatal Potential Endpoints status:   []   [x]     IP admin please see MAR (please add Box # to comment section on MAR) [x]     Current Outpatient Medications:    aspirin  81 MG chewable tablet, Chew 1 tablet (81 mg total) by mouth daily., Disp:  , Rfl:    atorvastatin  (LIPITOR ) 80 MG tablet, Take 1 tablet (80 mg total) by mouth 2 (two) times a week., Disp: 25 tablet, Rfl: 3   Carboxymethylcellul-Glycerin (CLEAR EYES FOR DRY EYES OP), Apply 1 drop to eye daily as needed (dry eyes)., Disp: , Rfl:    diclofenac (VOLTAREN) 75 MG EC tablet, Take 75 mg by mouth as needed for mild pain (pain score 1-3)., Disp: , Rfl:    Ferrous Sulfate (IRON) 325 (65 Fe) MG TABS, Take 1 tablet by mouth 3 (three) times a week., Disp: , Rfl:    Fexofenadine HCl (ALLEGRA PO), Take 1 tablet by mouth daily. , Disp: , Rfl:    Flaxseed, Linseed, (FLAXSEED OIL PO), Take 1 capsule by mouth daily., Disp: , Rfl:    fluticasone  (FLONASE ) 50 MCG/ACT nasal spray, Place 1 spray into both nostrils daily., Disp: , Rfl:    ibuprofen (ADVIL) 200 MG tablet, Take 200 mg by mouth every 6 (six) hours as needed for mild pain., Disp: , Rfl:    metoprolol   tartrate (LOPRESSOR ) 25 MG tablet, Take 0.5 tablets (12.5 mg total) by mouth 2 (two) times daily., Disp: 90 tablet, Rfl: 3   Misc Natural Products (JOINT SUPPORT COMPLEX PO), Take 1 tablet by mouth daily., Disp: , Rfl:    Multiple Vitamins-Minerals (MENS 50+ MULTI VITAMIN/MIN PO), Take 1 tablet by mouth daily., Disp: , Rfl:    nitroGLYCERIN  (NITROSTAT ) 0.4 MG SL tablet, Place 1 tablet (0.4 mg total) under the tongue every 5 (five) minutes as needed for chest pain., Disp: 25 tablet, Rfl: 2   NON FORMULARY, Take 25 mg by mouth daily. CBD tablets, Disp: , Rfl:    Omega-3 Fatty Acids (OMEGA-3 FISH OIL PO), Take 1 capsule by mouth daily., Disp: , Rfl:    REPATHA  SURECLICK 140 MG/ML SOAJ, ADMINISTER 1 ML UNDER THE SKIN EVERY 14 DAYS, Disp: 6 mL, Rfl: 3   Study - [name] - olpasiran (AMG 890) 142 mg/mL or placebo SQ injection (PI-Hilty), Inject 142 mg into the skin once. For Investigational Use Only. Inject 1 mL (1 prefilled syringe) subcutaneously into appropriate injection site per protocol every 12 weeks in clinic. (Approved injection site(s): upper arm, upper thigh & abdomen). Please contact  [facility] for Cardiology Research for any question or concerns regarding this medication., Disp: , Rfl:  timolol (TIMOPTIC) 0.25 % ophthalmic solution, Place 1 drop into both eyes 2 (two) times daily., Disp: , Rfl:

## 2023-12-30 NOTE — Research (Signed)
 [facility]  Informed Consent   Subject Name: Samuel [name]  Subject met inclusion and exclusion criteria.  The informed consent form, study requirements and expectations were reviewed with the subject and questions and concerns were addressed prior to the signing of the consent form.  The subject verbalized understanding of the trial requirements.  The subject agreed to participate in the [place] A trial and signed the informed consent on 12/30/2023.  The informed consent was obtained prior to performance of any protocol-specific procedures for the subject.  A copy of the signed informed consent was given to the subject and a copy was placed in the subject's medical record.     Subject re-consented to  Version: 7 IRB approved: [id number]  Candise Chambers [place]

## 2024-01-03 NOTE — Research (Addendum)
 Are there any labs that are clinically significant?  Yes []  OR No[x]   Mild anemia - appears he is on iron supplementation  Vinie KYM Maxcy, MD, [facility], FNLA, FACP  [place]  [facility]  Medical Director of the Advanced Lipid Disorders &  Cardiovascular Risk Reduction Clinic Diplomate of the American Board of Clinical Lipidology Attending Cardiologist  Direct Dial: [phone]  Fax: [phone]  Website:  [email or web address]   Please FORWARD back to me with any changes or follow up!   ACCESSION NO. [id number]                                             Page 1 of 2                                                        INVESTIGATOR: ([id number])                          PROTOCOL   [id number]                     Vinie Maxcy M.D.                             INVESTIGATOR NO.: [id number]                     c/o Reena Lies                              SUBJECT ID: [id number]                     [facility]                 SUBJECT INITIALS NOT COLLECTED:                     [address] [place] [address]                 VISIT: PATRIC [place], [place]  [id number]                   SPONSOR REPORT TO:                 COLLECTION TIME:08:32 DATE:30-Dec-2023                     Leora Shoe                      DATE RECEIVED IN LABORATORY: 31-Dec-2023                     c/o Sponsor(or Addtl) [facility].Study DATE REPORTED BY LABORATORY: 03-Jan-2024                     [facility]  SEX: M  BIRTHDATE:  11-Jan-1956    AGE: 32b                     [address]                  SELMA NO.: [phone]                   [place], [place]  [phone]                                                                                        Ref. Ranges               Clinical    Comments                                                                          Significance                                                                             Yes*  No                    CHEMISTRY PANEL                      Total Bili     0.3          0.2-1.2 mg/dL                                D-BilGen2      0.14         0.00-0.20 mg/dL                              Ind Bili       0.2          0.0-1.2 mg/dL                                Alk Phos       63           40-129 U/L                                   ALT (SGPT)     17           5-48 U/L  AST (SGOT)     22           8-40 U/L                                    Urea Nitr      23           4-24 mg/dL                                   Creatinine     1.02         0.45-1.35 mg/dL                              Calcium         9.3          8.3-10.6 mg/dL                              Total Prot     7.0          6.0-8.0 g/dL                                 Alb BCG        4.5          3.3-4.9 g/dL                                 CK             101          39-308 U/L                                   Sodium         140          135-145 mEq/L                                Potassium      4.3          3.5-5.2 mEq/L                                Bicarb         19.4         19.3-29.3 mEq/L                              Chloride       105          94-112 mEq/L                               ADJUSTED CALCIUM                       Adj Calc       9.3  8.3-10.6 mg/dL          HEMATOLOGY&DIFFERENTIAL PANEL                      HGB            13.0         12.5-17.0 g/dL                              HCT            36      L    37-51 %                 38   07/15/23                                   RBC            3.7     L    4.0-5.8 x106/uL    4.0    07/15/23                                      MCH            35      H    26-34 pg          35  07/15/23                                  MCHC           36           31-38 g/dL                                   RDW            12.6         12.0-15.0 %                                 RBC Morph      No Review Required                                        MCV            99           80-100 fL                                    WBC            5.73         3.80-10.70 x103/uL                           Neutrophil     3.17         1.96-7.23 x103/uL  Lymphocyte     2.05         0.80-3.00 x103/uL                           Monocytes      0.40         0.12-0.92 x103/uL                           Eosinophil     0.07         0.00-0.57 x103/uL                           Basophils      0.03         0.00-0.20 x103/uL                           Neutrophil     55.4         40.5-75.0 %                                 Lymphocyte     35.9         15.4-48.5%                                   Monocytes      7.0          2.6-10.1 %                                   Eosinophil     1.1          0.0-6.8 %                                    Basophils      0.5          0.0-2.0 %                                    Platelets      226          130-394 x103/uL                            ANC                      ANC            3.17         1.96-7.23 x103/uL    SM [id number] ANTIBODY COLL D/T                      CDate PreD     30-Dec-2023                                               CTime PreD  08:32    ALT > 3 X ULN                      ALT>3XULN      Criteria not met                                        ALT > 5 X ULN                      ALT>5XULN      Criteria not met                                        ALT > 8 X ULN                      ALT>8XULN      Criteria not met                                        ALT & TBIL                      ALT & TBIL     Criteria not met                                        AST > 3 X ULN                      AST>3XULN      Criteria not met                                        AST > 5 X ULN                      AST>5XULN      Criteria not met                                        AST > 8 X ULN                      AST>8XULN      Criteria not met                                         AST & TBIL                      AST & TBIL     Criteria not met                      DECREASE >/= EGFR 50%  eGFR > 50%     Criteria not met    EGFR                      CKDEPI eGF     81           mL/min/1.73m2                                                            No Ref Rng      ANION GAP                      Anion Gap      20      H    7-18 mEq/L       6.0  07/15/23   HBA1C                      Hgb A1c        5.8          <6.5%

## 2024-01-06 NOTE — Research (Addendum)
 Are there any labs that are clinically significant?  Yes []  OR No[x]   Please FORWARD back to me with any changes or follow up!    Vinie KYM Maxcy, MD, [facility], FNLA, FACP  [place]  [facility]  Medical Director of the Advanced Lipid Disorders &  Cardiovascular Risk Reduction Clinic Diplomate of the American Board of Clinical Lipidology Attending Cardiologist  Direct Dial: [phone]  Fax: [phone]  Website:  [email or web address]

## 2024-01-18 ENCOUNTER — Encounter: Payer: Self-pay | Admitting: *Deleted

## 2024-01-18 DIAGNOSIS — Z961 Presence of intraocular lens: Secondary | ICD-10-CM | POA: Diagnosis not present

## 2024-01-18 DIAGNOSIS — H52223 Regular astigmatism, bilateral: Secondary | ICD-10-CM | POA: Diagnosis not present

## 2024-01-18 DIAGNOSIS — H401131 Primary open-angle glaucoma, bilateral, mild stage: Secondary | ICD-10-CM | POA: Diagnosis not present

## 2024-01-18 DIAGNOSIS — Z9849 Cataract extraction status, unspecified eye: Secondary | ICD-10-CM | POA: Diagnosis not present

## 2024-01-18 DIAGNOSIS — H524 Presbyopia: Secondary | ICD-10-CM | POA: Diagnosis not present

## 2024-01-18 DIAGNOSIS — H47233 Glaucomatous optic atrophy, bilateral: Secondary | ICD-10-CM | POA: Diagnosis not present

## 2024-01-18 DIAGNOSIS — H5231 Anisometropia: Secondary | ICD-10-CM | POA: Diagnosis not present

## 2024-01-25 ENCOUNTER — Other Ambulatory Visit (HOSPITAL_BASED_OUTPATIENT_CLINIC_OR_DEPARTMENT_OTHER): Payer: Self-pay

## 2024-01-25 ENCOUNTER — Ambulatory Visit (HOSPITAL_BASED_OUTPATIENT_CLINIC_OR_DEPARTMENT_OTHER)
Admission: RE | Admit: 2024-01-25 | Discharge: 2024-01-25 | Disposition: A | Discharge status: Home / Self Care | Source: Ambulatory Visit | Attending: Family Medicine | Admitting: Family Medicine

## 2024-01-25 ENCOUNTER — Encounter (HOSPITAL_BASED_OUTPATIENT_CLINIC_OR_DEPARTMENT_OTHER): Payer: Self-pay

## 2024-01-25 VITALS — BP 146/81 | HR 56 | Temp 98.4°F | Resp 20

## 2024-01-25 DIAGNOSIS — H6993 Unspecified Eustachian tube disorder, bilateral: Secondary | ICD-10-CM

## 2024-01-25 MED ORDER — AMOXICILLIN 875 MG PO TABS
875.0000 mg | ORAL_TABLET | Freq: Two times a day (BID) | ORAL | 0 refills | Status: AC
Start: 2024-01-25 — End: 2024-02-01
  Filled 2024-01-25: qty 14, 7d supply, fill #0

## 2024-01-25 MED ORDER — PREDNISONE 20 MG PO TABS
40.0000 mg | ORAL_TABLET | Freq: Every day | ORAL | 0 refills | Status: AC
  Filled 2024-01-25: qty 10, 5d supply, fill #0

## 2024-01-25 NOTE — Discharge Instructions (Signed)
 Treating you for Eustachian Tube dysfunction and potential sinus infection.  See ENT for follow up for continued issues.  You can continue the Allegra and recommend Flonase  daily

## 2024-01-25 NOTE — ED Triage Notes (Signed)
 States had a tire blow up close to his ear a few weeks ago. Has had ringing in ears since. Left is worse. Hearing was affected and has improved slightly. Patient reports has seasonal allergies that have worsened over the years. States Ears feel plugged up. Headache and drainage.

## 2024-01-25 NOTE — ED Provider Notes (Signed)
 [name]    CSN: [id number] Arrival date & time: 01/25/24  0845      History   Chief Complaint Chief Complaint  Patient presents with   Allergic Reaction    also loss of hearing - Entered by patient   Hearing Problem    HPI Samuel [name] is a 68 y.o. male.   Patient is a 68 year old male who presents today with bilateral ear fullness, decreased hearing, sinus congestion.  Has a history of chronic allergies and takes Allegra for this.  States had a tire blow up close to his ear a few weeks ago. Has had ringing in ears since. Left is worse. Hearing was affected and has improved slightly. Patient reports has seasonal allergies that have worsened over the years. States Ears feel plugged up. Headache and drainage. No fevers, chills.  Does have a frontal headache   Allergic Reaction   Past Medical History:  Diagnosis Date   Acute MI, anterior wall (HCC) 02/12/2019   Acute ST elevation myocardial infarction (STEMI) involving left anterior descending (LAD) coronary artery without development of Q waves (HCC) 02/12/2019   Acute ST elevation myocardial infarction (STEMI) of anterior wall (HCC) 02/12/2019   Aortic regurgitation 04/19/2019   CAD (coronary artery disease), native coronary artery    a. 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   Aortic regurgitation 04/19/2019   CAD in native artery 02/20/2019   LV dysfunction 02/20/2019   Acute ST  elevation myocardial infarction (STEMI) of anterior wall (HCC) 02/12/2019   Hyperlipidemia with target LDL less than 70 02/12/2019   Acute ST elevation myocardial infarction (STEMI) involving left anterior descending (LAD) coronary artery without development of Q waves (HCC) 02/12/2019   Acute MI, anterior wall (HCC) 02/12/2019    Past Surgical History:  Procedure Laterality Date   CARDIAC CATHETERIZATION     CATARACT EXTRACTION, BILATERAL Bilateral 10/2020   Glaucoma repair   CORONARY/GRAFT ACUTE MI REVASCULARIZATION N/A 02/12/2019   Procedure: Coronary/Graft Acute MI Revascularization;  Surgeon: Anner Alm ORN, MD;  Location: [facility] INVASIVE CV LAB;  Service: Cardiovascular;  Laterality: N/A;   HAND SURGERY     right   LEFT HEART CATH AND CORONARY ANGIOGRAPHY N/A 02/12/2019   Procedure: LEFT HEART CATH AND CORONARY ANGIOGRAPHY;  Surgeon: Anner Alm ORN, MD;  Location: [facility] INVASIVE CV LAB;  Service: Cardiovascular;  Laterality: N/A;       Home Medications    Prior to Admission medications   Medication Sig Start Date End Date Taking? Authorizing Provider  amoxicillin  (AMOXIL ) 875 MG tablet Take 1 tablet (875 mg total) by mouth 2 (two) times daily for 7 days. 01/25/24 02/01/24 Yes Kandy Towery A, FNP  predniSONE  (DELTASONE ) 20 MG tablet Take 2 tablets (40 mg total) by mouth daily with breakfast for 5 days. 01/25/24 01/30/24 Yes Maebelle Sulton, Wilbert LABOR, FNP  aspirin  81 MG chewable tablet Chew 1 tablet (  81 mg total) by mouth daily. 02/15/19   Kroeger, Krista M., PA-C  atorvastatin  (LIPITOR ) 80 MG tablet Take 1 tablet (80 mg total) by mouth 2 (two) times a week. 09/23/23   Monetta Redell PARAS, MD  Carboxymethylcellul-Glycerin (CLEAR EYES FOR DRY EYES OP) Apply 1 drop to eye daily as needed (dry eyes).    [provider]  diclofenac (VOLTAREN) 75 MG EC tablet Take 75 mg by mouth as needed for mild pain (pain score 1-3). 07/20/23   [provider]  Ferrous Sulfate (IRON) 325 (65 Fe) MG TABS Take 1  tablet by mouth 3 (three) times a week.    [provider]  Fexofenadine HCl (ALLEGRA PO) Take 1 tablet by mouth daily.     [provider]  Flaxseed, Linseed, (FLAXSEED OIL PO) Take 1 capsule by mouth daily.    [provider]  fluticasone  (FLONASE ) 50 MCG/ACT nasal spray Place 1 spray into both nostrils daily.    [provider]  ibuprofen (ADVIL) 200 MG tablet Take 200 mg by mouth every 6 (six) hours as needed for mild pain.    [provider]  metoprolol  tartrate (LOPRESSOR ) 25 MG tablet Take 0.5 tablets (12.5 mg total) by mouth 2 (two) times daily. 09/22/23   Monetta Redell PARAS, MD  Misc Natural Products (JOINT SUPPORT COMPLEX PO) Take 1 tablet by mouth daily.    [provider]  Multiple Vitamins-Minerals (MENS 50+ MULTI VITAMIN/MIN PO) Take 1 tablet by mouth daily.    [provider]  nitroGLYCERIN  (NITROSTAT ) 0.4 MG SL tablet Place 1 tablet (0.4 mg total) under the tongue every 5 (five) minutes as needed for chest pain. 06/16/23   Monetta Redell PARAS, MD  NON FORMULARY Take 25 mg by mouth daily. CBD tablets    [provider]  Omega-3 Fatty Acids (OMEGA-3 FISH OIL PO) Take 1 capsule by mouth daily.    [provider]  REPATHA  SURECLICK 140 MG/ML SOAJ ADMINISTER 1 ML UNDER THE SKIN EVERY 14 DAYS 12/07/23   Monetta Redell PARAS, MD  Study - [name] - olpasiran (AMG 890) 142 mg/mL or placebo SQ injection (PI-Hilty) Inject 142 mg into the skin once. For Investigational Use Only. Inject 1 mL (1 prefilled syringe) subcutaneously into appropriate injection site per protocol every 12 weeks in clinic. (Approved injection site(s): upper arm, upper thigh & abdomen). Please contact  [facility] Research for any question or concerns regarding this medication. 09/11/21   Hilty, Vinie BROCKS, MD  timolol (TIMOPTIC) 0.25 % ophthalmic solution Place 1 drop into both eyes 2 (two) times daily. 04/11/23   [provider]     Family History Family History  Problem Relation Age of Onset   CAD Father 81       Ended up with CABG   CAD Paternal Uncle    Heart attack Mother    Hypertension Mother    Parkinson's disease Mother    Diabetes Sister    Hypertension Sister    Heart Problems Brother     Social History Social History   Tobacco Use   Smoking status: Never    Passive exposure: Never   Smokeless tobacco: Never  Vaping Use   Vaping status: Never Used  Substance Use Topics   Alcohol use: No    Alcohol/week: 0.0 standard drinks of alcohol   Drug use: No     Allergies   Sulfa antibiotics   Review of Systems Review of Systems See HPI  Physical Exam Triage Vital Signs ED Triage Vitals  Encounter Vitals Group     BP 01/25/24 0853 (!) 146/81     Girls Systolic BP Percentile --      Girls Diastolic BP Percentile --      Boys Systolic BP Percentile --      Boys Diastolic BP Percentile --      Pulse Rate 01/25/24 0853 (!) 56     Resp 01/25/24 0853 20     Temp 01/25/24 0853 98.4 F (36.9 C)     Temp Source 01/25/24 0853 Oral     SpO2 01/25/24 0853 97 %     Weight --      Height --      Head Circumference --      Peak Flow --      Pain Score 01/25/24 0854 2     Pain Loc --      Pain Education --      Exclude from Growth Chart --    No data found.  Updated Vital Signs BP (!) 146/81 (BP Location: Right Arm)   Pulse (!) 56   Temp 98.4 F (36.9 C) (Oral)   Resp 20   SpO2 97%   Visual Acuity Right Eye Distance:   Left Eye Distance:   Bilateral Distance:    Right Eye Near:   Left Eye Near:    Bilateral Near:     Physical Exam Constitutional:      General: He is not in acute distress.    Appearance: Normal appearance. He is not ill-appearing, toxic-appearing or diaphoretic.  HENT:     Right Ear: Tympanic membrane, ear canal and external ear normal.     Left Ear: Tympanic membrane, ear canal and external ear normal.     Nose: Congestion present.     Mouth/Throat:      Pharynx: Oropharynx is clear.  Eyes:     Conjunctiva/sclera: Conjunctivae normal.  Cardiovascular:     Rate and Rhythm: Normal rate and regular rhythm.     Pulses: Normal pulses.     Heart sounds: Normal heart sounds.  Pulmonary:     Effort: Pulmonary effort is normal.     Breath sounds: Normal breath sounds.  Musculoskeletal:        General: Normal range of motion.  Skin:    General: Skin is warm and dry.  Neurological:     Mental Status: He is alert.  Psychiatric:        Mood and Affect: Mood normal.      UC Treatments / Results  Labs (all labs ordered are listed, but only abnormal results are displayed) Labs Reviewed - No data to display  EKG   Radiology No results found.  Procedures Procedures (including critical care time)  Medications Ordered in UC Medications - No data to display  Initial Impression / Assessment and Plan / UC Course  I have reviewed the triage vital signs and the nursing notes.  Pertinent labs & imaging results that were available during my care of the patient were reviewed by me and considered in my medical decision making (see chart for details).     Eustachian tube dysfunction-no specific concerns on exam.  Believe he has eustachian tube dysfunction.  Treating with prednisone  to see if this will help.  Recommend continue allergy medications Also believe he may have a sinus infection.  Will go ahead and treat with amoxicillin  at this time.  He has an appointment in 5  weeks to see ENT for follow-up.  Recommend he continue with this appointment for recheck  Final Clinical Impressions(s) / UC Diagnoses   Final diagnoses:  Dysfunction of both eustachian tubes     Discharge Instructions      Treating you for Eustachian Tube dysfunction and potential sinus infection.  See ENT for follow up for continued issues.  You can continue the Allegra and recommend Flonase  daily     ED Prescriptions     Medication Sig Dispense Auth.  Provider   predniSONE  (DELTASONE ) 20 MG tablet Take 2 tablets (40 mg total) by mouth daily with breakfast for 5 days. 10 tablet Mada Sadik A, FNP   amoxicillin  (AMOXIL ) 875 MG tablet Take 1 tablet (875 mg total) by mouth 2 (two) times daily for 7 days. 14 tablet Adah Wilbert LABOR, FNP      PDMP not reviewed this encounter.   Adah Wilbert LABOR, FNP 01/25/24 1043

## 2024-02-07 NOTE — Research (Signed)
 [place] [facility]  Informed Consent   Subject Name: Samuel [name]  Subject met inclusion and exclusion criteria.  The informed consent form, study requirements and expectations were reviewed with the subject and questions and concerns were addressed prior to the signing of the consent form.  The subject verbalized understanding of the trial requirements.  The subject agreed to participate in the [place]  trial and signed the informed consent on 12/30/2023.  The informed consent was obtained prior to performance of any protocol-specific procedures for the subject.  A copy of the signed informed consent was given to the subject and a copy was placed in the subject's medical record.     Subject re-consented to  Version: 7 IRB approved:  [id number]  [name]

## 2024-03-02 DIAGNOSIS — H9312 Tinnitus, left ear: Secondary | ICD-10-CM | POA: Diagnosis not present

## 2024-03-02 DIAGNOSIS — H833X9 Noise effects on inner ear, unspecified ear: Secondary | ICD-10-CM | POA: Diagnosis not present

## 2024-03-02 DIAGNOSIS — H93242 Temporary auditory threshold shift, left ear: Secondary | ICD-10-CM | POA: Diagnosis not present

## 2024-03-02 DIAGNOSIS — H9190 Unspecified hearing loss, unspecified ear: Secondary | ICD-10-CM | POA: Diagnosis not present

## 2024-03-23 ENCOUNTER — Encounter: Admitting: *Deleted

## 2024-03-23 VITALS — BP 145/71 | HR 62 | Temp 98.4°F | Resp 14 | Wt 151.0 lb

## 2024-03-23 DIAGNOSIS — Z006 Encounter for examination for normal comparison and control in clinical research program: Secondary | ICD-10-CM

## 2024-03-23 MED ORDER — STUDY - OCEAN(A) - OLPASIRAN (AMG 890) 142 MG/ML OR PLACEBO SQ INJECTION (PI-HILTY)
142.0000 mg | PREFILLED_SYRINGE | Freq: Once | SUBCUTANEOUS | Status: AC
  Administered 2024-03-23: 142 mg via SUBCUTANEOUS
  Filled 2024-03-23: qty 1

## 2024-03-23 NOTE — Research (Signed)
 Samuel Samuel  Samuel Samuel  Vitals: [x]  Experience any AE/SAE/Hospitalizations []  Yes [x]  No  If yes please explain:  Labs collected:  no labs  Discussed with patient about the importance of not letting any one draw cholesterol or lipids. As to we are blinded to results. Reassured patient if we needed to be contacted the study team would reach out to our unblinded person.   ~reminder labs next visit 144 to see MD and fasting labs ~  Non-Fatal Potential Endpoint Assessment Yes  No   Has the subject experienced/undergone any of the following since the last visit/contact?   []   [x]    Any Coronary Artery Revascularization/Cerebrovascular Revascularization/ Peripheral Artery Revascularization/Amputation Procedure   []   [x]    Myocardial Infarction []   [x]    Stroke   []   [x]    Provide the date for the non-fatal Potential Endpoints status as of today visit date    []   [x]     IP admin please see MAR (please add Box # to comment section on MAR) [x]    Current Outpatient Medications:    aspirin  81 MG chewable tablet, Chew 1 tablet (81 mg total) by mouth daily., Disp:  , Rfl:    atorvastatin  (LIPITOR ) 80 MG tablet, Take 1 tablet (80 mg total) by mouth 2 (two) times Samuel Samuel., Disp: 25 tablet, Rfl: 3   Carboxymethylcellul-Glycerin (CLEAR EYES FOR DRY EYES OP), Apply 1 drop to eye daily as needed (dry eyes)., Disp: , Rfl:    diclofenac (VOLTAREN) 75 MG EC tablet, Take 75 mg by mouth as needed for mild pain (pain score 1-3)., Disp: , Rfl:    Ferrous Sulfate (IRON) 325 (65 Fe) MG TABS, Take 1 tablet by mouth 3 (three) times Samuel Samuel., Disp: , Rfl:    Fexofenadine HCl (ALLEGRA PO), Take 1 tablet by mouth daily. , Disp: , Rfl:    Flaxseed, Linseed, (FLAXSEED OIL PO), Take 1 capsule by mouth daily., Disp: , Rfl:    fluticasone  (FLONASE ) 50 MCG/ACT nasal spray, Place 1 spray into both nostrils daily., Disp: , Rfl:    ibuprofen (ADVIL) 200 MG tablet, Take 200 mg by mouth every 6 (six) hours as needed for mild pain.,  Disp: , Rfl:    metoprolol  tartrate (LOPRESSOR ) 25 MG tablet, Take 0.5 tablets (12.5 mg total) by mouth 2 (two) times daily., Disp: 90 tablet, Rfl: 3   Misc Natural Products (JOINT SUPPORT COMPLEX PO), Take 1 tablet by mouth daily., Disp: , Rfl:    Multiple Vitamins-Minerals (MENS 50+ MULTI VITAMIN/MIN PO), Take 1 tablet by mouth daily., Disp: , Rfl:    nitroGLYCERIN  (NITROSTAT ) 0.4 MG SL tablet, Place 1 tablet (0.4 mg total) under the tongue every 5 (five) minutes as needed for chest pain., Disp: 25 tablet, Rfl: 2   NON FORMULARY, Take 25 mg by mouth daily. CBD tablets, Disp: , Rfl:    Omega-3 Fatty Acids (OMEGA-3 FISH OIL PO), Take 1 capsule by mouth daily., Disp: , Rfl:    REPATHA  SURECLICK 140 MG/ML SOAJ, ADMINISTER 1 ML UNDER THE SKIN EVERY 14 DAYS, Disp: 6 mL, Rfl: 3   Study - Samuel(Samuel) - olpasiran (AMG 890) 142 mg/mL or placebo SQ injection (PI-Hilty), Inject 142 mg into the skin once. For Investigational Use Only. Inject 1 mL (1 prefilled syringe) subcutaneously into appropriate injection site per protocol every 12 weeks in clinic. (Approved injection site(s): upper arm, upper thigh & abdomen). Please contact  [facility] for [facility] Research for any question or concerns regarding  this medication., Disp: , Rfl:    timolol (TIMOPTIC) 0.25 % ophthalmic solution, Place 1 drop into both eyes 2 (two) times daily., Disp: , Rfl:   Current Facility-Administered Medications:    Study - Samuel(Samuel) - olpasiran (AMG 890) 142 mg/mL or placebo SQ injection (PI-Hilty), 142 mg, Subcutaneous, Once,

## 2024-04-28 DIAGNOSIS — H47233 Glaucomatous optic atrophy, bilateral: Secondary | ICD-10-CM | POA: Diagnosis not present

## 2024-04-28 DIAGNOSIS — H4010X1 Unspecified open-angle glaucoma, mild stage: Secondary | ICD-10-CM | POA: Diagnosis not present

## 2024-04-28 DIAGNOSIS — H401131 Primary open-angle glaucoma, bilateral, mild stage: Secondary | ICD-10-CM | POA: Diagnosis not present

## 2024-04-28 DIAGNOSIS — H4020X1 Unspecified primary angle-closure glaucoma, mild stage: Secondary | ICD-10-CM | POA: Diagnosis not present

## 2024-05-15 ENCOUNTER — Ambulatory Visit: Admitting: Allergy and Immunology

## 2024-05-15 ENCOUNTER — Encounter: Payer: Self-pay | Admitting: Allergy and Immunology

## 2024-05-15 VITALS — BP 124/82 | HR 61 | Resp 16 | Ht 67.0 in | Wt 155.4 lb

## 2024-05-15 DIAGNOSIS — J3089 Other allergic rhinitis: Secondary | ICD-10-CM | POA: Diagnosis not present

## 2024-05-15 DIAGNOSIS — H6993 Unspecified Eustachian tube disorder, bilateral: Secondary | ICD-10-CM

## 2024-05-15 NOTE — Patient Instructions (Addendum)
  1. Return to clinic for skin testing (no antihistamines)  2. Treat and prevent inflammation of upper airway / eustachian tubes:   A. Fluticasone  - 1 spray each nostril 2 times per day  B. Azelastine - 1 spray each nostril 2 times per day  3. If needed:   A. Fexofenadine 180 - 1 tablet 1 time per day   4. Influenza = Tamiflu. Covid = Paxlovid

## 2024-05-15 NOTE — Progress Notes (Unsigned)
 [place] - [place] - [place] - [place]   Dear Samuel,  Thank you for referring Samuel [name] to the [facility] [place]  on 05/15/2024.   Below is a summation of this patient's evaluation and recommendations.  Thank you for your referral. I will keep you informed about this patient's response to treatment.   If you have any questions please do not hesitate to contact me.   Sincerely,  Samuel DOROTHA Denis, MD Allergy / Immunology [facility] [place]    ______________________________________________________________________    NEW PATIENT NOTE  Referring Provider: [name] LABOR, MD Primary Provider: [name] LABOR, MD Date of office visit: 05/15/2024    Subjective:   Chief Complaint:  Samuel [name] (DOB: 23-Nov-1955) is a 68 y.o. male who presents to the clinic on 05/15/2024 with a chief complaint of Allergies .     HPI: [name] presents to this clinic in evaluation of allergies and ear tube problems.  He has had a long history of allergic rhinitis for which he uses nasal fluticasone  and antihistamines and decongestants.  He thought he was doing relatively well with this issue but something changed over the course of the past year.  He has had a lot more nasal congestion and intermittent head pressure and some occasional watery eyes.  There is not really an obvious provoking factor giving rise to this change.  In addition, he has had a lot more issues with his ears.  He has had some hearing loss and some occasional popping and he tries to auto inflate his ears.  He is not wearing hearing aids.  It has been suggested to him that he not use any type of oral decongestant because of his coronary artery condition including a myocardial infarction in 2020.  But he does receive significant improvement regarding his issues with his nose and his ears while doing so.  Past Medical History:  Diagnosis Date   Acute MI,  anterior wall (HCC) 02/12/2019   Acute ST elevation myocardial infarction (STEMI) involving left anterior descending (LAD) coronary artery without development of Q waves (HCC) 02/12/2019   Acute ST elevation myocardial infarction (STEMI) of anterior wall (HCC) 02/12/2019   Aortic regurgitation 04/19/2019   CAD (coronary artery disease), native coronary artery    a. 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   Glaucoma    Hyperlipidemia LDL goal <70    Hyperlipidemia with target LDL less than 70 02/12/2019   Ischemic cardiomyopathy    LV dysfunction 02/20/2019   Seasonal allergies 02/14/2019    Past Surgical History:  Procedure Laterality Date   CARDIAC CATHETERIZATION     CATARACT EXTRACTION, BILATERAL Bilateral 10/2020   Glaucoma repair   CORONARY/GRAFT ACUTE MI REVASCULARIZATION N/A 02/12/2019   Procedure: Coronary/Graft Acute MI Revascularization;  Surgeon: Anner Alm ORN, MD;  Location: [facility] INVASIVE CV LAB;  Service: Cardiovascular;  Laterality: N/A;   HAND SURGERY     right   LEFT HEART CATH AND CORONARY ANGIOGRAPHY N/A 02/12/2019   Procedure: LEFT HEART CATH AND CORONARY ANGIOGRAPHY;  Surgeon: Anner Alm ORN, MD;  Location: [facility] INVASIVE CV LAB;  Service: Cardiovascular;  Laterality: N/A;    Allergies as of 05/15/2024       Reactions   Sulfa Antibiotics Other (See Comments)   Pt unable to recall  reaction        Medication List    ALLEGRA PO Take 1 tablet by mouth daily.   aspirin  81 MG chewable tablet Chew 1 tablet (81 mg total) by mouth daily.   atorvastatin  80 MG tablet Commonly known as: LIPITOR  Take 1 tablet (80 mg total) by mouth 2 (two) times a week.   CLEAR EYES FOR DRY EYES OP Apply 1 drop to eye daily as needed (dry eyes).   diclofenac 75 MG EC tablet Commonly known as: VOLTAREN Take 75 mg by mouth as needed for mild pain (pain score 1-3).    FLAXSEED OIL PO Take 1 capsule by mouth daily.   fluticasone  50 MCG/ACT nasal spray Commonly known as: FLONASE  Place 1 spray into both nostrils daily.   ibuprofen 200 MG tablet Commonly known as: ADVIL Take 200 mg by mouth every 6 (six) hours as needed for mild pain.   Iron 325 (65 Fe) MG Tabs Take 1 tablet by mouth 3 (three) times a week.   JOINT SUPPORT COMPLEX PO Take 1 tablet by mouth daily.   MENS 50+ MULTI VITAMIN/MIN PO Take 1 tablet by mouth daily.   metoprolol  tartrate 25 MG tablet Commonly known as: LOPRESSOR  Take 0.5 tablets (12.5 mg total) by mouth 2 (two) times daily.   nitroGLYCERIN  0.4 MG SL tablet Commonly known as: NITROSTAT  Place 1 tablet (0.4 mg total) under the tongue every 5 (five) minutes as needed for chest pain.   NON FORMULARY Take 25 mg by mouth daily. CBD tablets   [name] olpasiran (AMG 890) or placebo 142 mg/mL SQ injection Inject 142 mg into the skin once. For Investigational Use Only. Inject 1 mL (1 prefilled syringe) subcutaneously into appropriate injection site per protocol every 12 weeks in clinic. (Approved injection site(s): upper arm, upper thigh & abdomen). Please contact  [facility] for Cardiology Research for any question or concerns regarding this medication.   OMEGA-3 FISH OIL PO Take 1 capsule by mouth daily.   Repatha  SureClick 140 MG/ML Soaj Generic drug: Evolocumab  ADMINISTER 1 ML UNDER THE SKIN EVERY 14 DAYS   timolol 0.25 % ophthalmic solution Commonly known as: TIMOPTIC Place 1 drop into both eyes 2 (two) times daily.    Review of systems negative except as noted in HPI / PMHx or noted below:  Review of Systems  Constitutional: Negative.   HENT: Negative.    Eyes: Negative.   Respiratory: Negative.    Cardiovascular: Negative.   Gastrointestinal: Negative.   Genitourinary: Negative.   Musculoskeletal: Negative.   Skin: Negative.   Neurological: Negative.   Endo/Heme/Allergies: Negative.    Psychiatric/Behavioral: Negative.      Family History  Problem Relation Age of Onset   CAD Father 35       Ended up with CABG   CAD Paternal Uncle    Heart attack Mother    Hypertension Mother    Parkinson's disease Mother    Diabetes Sister    Hypertension Sister    Heart Problems Brother     Social History   Socioeconomic History   Marital status: Married    Spouse name: Not on file   Number of children: Not on file   Years of education: Not on file   Highest education level: Not on file  Occupational History   Not on file  Tobacco Use   Smoking status: Never    Passive exposure: Never   Smokeless tobacco: Never  Vaping Use   Vaping status: Never  Used  Substance and Sexual Activity   Alcohol use: No    Alcohol/week: 0.0 standard drinks of alcohol   Drug use: No   Sexual activity: Not on file  Other Topics Concern   Not on file  Social History Narrative   Married   Patient is very active, he walks usually 2 to 4 miles a day, and bikes 20 to 30 miles 2 times a week.   Non-smoker   Social Drivers of Corporate Investment Banker Strain: Not on file  Food Insecurity: Not on file  Transportation Needs: Not on file  Physical Activity: Not on file  Stress: Not on file  Social Connections: Not on file  Intimate Partner Violence: Not on file    Environmental and Social history  Lives in a house with a dry environment, cats look inside the household, no carpet in the bedroom, plastic in the bed, plastic on the pillow, no smoking ongoing with inside the household.  Objective:   Vitals:   05/15/24 0941  BP: 124/82  Pulse: 61  Resp: 16  SpO2: 98%   Height: 5' 7 (170.2 cm) Weight: 155 lb 6.4 oz (70.5 kg)  Physical Exam Constitutional:      Appearance: He is not diaphoretic.  HENT:     Head: Normocephalic.     Right Ear: Tympanic membrane, ear canal and external ear normal.     Left Ear: Tympanic membrane, ear canal and external ear normal.     Ears:      Comments: Bilateral hearing aids    Nose: Nose normal. No mucosal edema or rhinorrhea.     Mouth/Throat:     Pharynx: Uvula midline. No oropharyngeal exudate.  Eyes:     Conjunctiva/sclera: Conjunctivae normal.  Neck:     Thyroid: No thyromegaly.     Trachea: Trachea normal. No tracheal tenderness or tracheal deviation.  Cardiovascular:     Rate and Rhythm: Normal rate and regular rhythm.     Heart sounds: Normal heart sounds, S1 normal and S2 normal. No murmur heard. Pulmonary:     Effort: No respiratory distress.     Breath sounds: Normal breath sounds. No stridor. No wheezing or rales.  Lymphadenopathy:     Head:     Right side of head: No tonsillar adenopathy.     Left side of head: No tonsillar adenopathy.     Cervical: No cervical adenopathy.  Skin:    Findings: No erythema or rash.     Nails: There is no clubbing.  Neurological:     Mental Status: He is alert.     Diagnostics: Allergy skin tests were performed.   Spirometry was performed and demonstrated an FEV1 of *** @ *** % of predicted. FEV1/FVC = ***  The patient had an Asthma Control Test with the following results:  .     Assessment and Plan:    No diagnosis found.  Patient Instructions   1. Return to clinic for skin testing (no antihistamines)  2. Treat and prevent inflammation of upper airway / eustachian tubes:   A. Fluticasone  - 1 spray each nostril 2 times per day  B. Azelastine - 1 spray each nostril 2 times per day  3. If needed:   A. Fexofenadine 180 - 1 tablet 1 time per day   4. Influenza = Tamiflu. Covid = Paxlovid   Samuel DOROTHA Denis, MD Allergy / Immunology [facility] [place] 

## 2024-05-16 ENCOUNTER — Other Ambulatory Visit: Payer: Self-pay

## 2024-05-16 ENCOUNTER — Encounter: Payer: Self-pay | Admitting: Allergy and Immunology

## 2024-05-16 MED ORDER — AZELASTINE HCL 0.1 % NA SOLN: 1.0000 | Freq: Two times a day (BID) | NASAL | 5 refills | Status: AC

## 2024-05-22 ENCOUNTER — Ambulatory Visit: Admitting: Allergy and Immunology

## 2024-05-22 DIAGNOSIS — J301 Allergic rhinitis due to pollen: Secondary | ICD-10-CM | POA: Diagnosis not present

## 2024-05-24 ENCOUNTER — Encounter: Payer: Self-pay | Admitting: Allergy and Immunology

## 2024-05-24 NOTE — Progress Notes (Signed)
 Khamani returns to this clinic to have skin testing performed.  Allergy skin testing identified hypersensitivity against tree, grass, weed.  Allergen avoidance measures were provided.

## 2024-06-15 ENCOUNTER — Encounter: Admitting: *Deleted

## 2024-06-15 VITALS — BP 144/63 | HR 50 | Temp 97.2°F | Resp 18 | Wt 155.0 lb

## 2024-06-15 DIAGNOSIS — Z006 Encounter for examination for normal comparison and control in clinical research program: Secondary | ICD-10-CM

## 2024-06-15 MED ORDER — STUDY - OCEAN(A) - OLPASIRAN (AMG 890) 142 MG/ML OR PLACEBO SQ INJECTION (PI-HILTY)
142.0000 mg | PREFILLED_SYRINGE | Freq: Once | SUBCUTANEOUS | Status: AC
  Administered 2024-06-15: 142 mg via SUBCUTANEOUS
  Filled 2024-06-15: qty 1

## 2024-06-15 NOTE — Research (Signed)
 Ocean A  Week 144  Vitals: [x]  Med reviews: [x]  Labs: (week 48): [x]   Experience any AE/SAE/Hospitalizations []  Yes [x]  No  If yes please explain:   Discussed with patient about the importance of not letting any one draw cholesterol or lipids. As to we are blinded to results. Reassured patient if we needed to be contacted the study team would reach out to our unblinded person.   ~no labs @ week 156~  Non-Fatal Potential Endpoint Assessment Yes  No   Has the subject experienced/undergone any of the following since the last visit/contact?   []   [x]    Any Coronary Artery Revascularization/Cerebrovascular Revascularization/ Peripheral Artery Revascularization/Amputation Procedure   []   [x]    Myocardial Infarction []   [x]    Stroke   []   [x]    Provide the date for the non-fatal Potential Endpoints status as of today visit date    []   [x]     IP admin please see MAR (please add Box # to comment section on MAR) [x]     Current Outpatient Medications:    aspirin  81 MG chewable tablet, Chew 1 tablet (81 mg total) by mouth daily., Disp:  , Rfl:    atorvastatin  (LIPITOR ) 80 MG tablet, Take 1 tablet (80 mg total) by mouth 2 (two) times a week., Disp: 25 tablet, Rfl: 3   azelastine  (ASTELIN ) 0.1 % nasal spray, Place 1 spray into both nostrils 2 (two) times daily., Disp: 30 mL, Rfl: 5   Carboxymethylcellul-Glycerin (CLEAR EYES FOR DRY EYES OP), Apply 1 drop to eye daily as needed (dry eyes)., Disp: , Rfl:    diclofenac (VOLTAREN) 75 MG EC tablet, Take 75 mg by mouth as needed for mild pain (pain score 1-3)., Disp: , Rfl:    Ferrous Sulfate (IRON) 325 (65 Fe) MG TABS, Take 1 tablet by mouth 3 (three) times a week., Disp: , Rfl:    Fexofenadine HCl (ALLEGRA PO), Take 1 tablet by mouth daily. , Disp: , Rfl:    Flaxseed, Linseed, (FLAXSEED OIL PO), Take 1 capsule by mouth daily., Disp: , Rfl:    fluticasone  (FLONASE ) 50 MCG/ACT nasal spray, Place 1 spray into both nostrils daily., Disp: , Rfl:     ibuprofen (ADVIL) 200 MG tablet, Take 200 mg by mouth every 6 (six) hours as needed for mild pain., Disp: , Rfl:    metoprolol  tartrate (LOPRESSOR ) 25 MG tablet, Take 0.5 tablets (12.5 mg total) by mouth 2 (two) times daily., Disp: 90 tablet, Rfl: 3   Misc Natural Products (JOINT SUPPORT COMPLEX PO), Take 1 tablet by mouth daily., Disp: , Rfl:    Multiple Vitamins-Minerals (MENS 50+ MULTI VITAMIN/MIN PO), Take 1 tablet by mouth daily., Disp: , Rfl:    nitroGLYCERIN  (NITROSTAT ) 0.4 MG SL tablet, Place 1 tablet (0.4 mg total) under the tongue every 5 (five) minutes as needed for chest pain., Disp: 25 tablet, Rfl: 2   NON FORMULARY, Take 25 mg by mouth daily. CBD tablets, Disp: , Rfl:    Omega-3 Fatty Acids (OMEGA-3 FISH OIL PO), Take 1 capsule by mouth daily., Disp: , Rfl:    REPATHA  SURECLICK 140 MG/ML SOAJ, ADMINISTER 1 ML UNDER THE SKIN EVERY 14 DAYS, Disp: 6 mL, Rfl: 3   Study - [name] - olpasiran (AMG 890) 142 mg/mL or placebo SQ injection (PI-Hilty), Inject 142 mg into the skin once. For Investigational Use Only. Inject 1 mL (1 prefilled syringe) subcutaneously into appropriate injection site per protocol every 12 weeks in clinic. (Approved injection site(s):  upper arm, upper thigh & abdomen). Please contact  [facility] for Cardiology Research for any question or concerns regarding this medication., Disp: , Rfl:    timolol (TIMOPTIC) 0.25 % ophthalmic solution, Place 1 drop into both eyes 2 (two) times daily., Disp: , Rfl:   Current Facility-Administered Medications:    Study - [name] - olpasiran (AMG 890) 142 mg/mL or placebo SQ injection (PI-Hilty), 142 mg, Subcutaneous, Once,

## 2024-06-15 NOTE — Progress Notes (Signed)
 The patient is in for follow up.  He continues to do well, and has recently seen Dr.Munley in follow up.  He denies chest pain.  He has no known problems with IP.      06/15/2024    8:26 AM    BP 144/63  BP Location Left Arm  Patient Position Sitting  Cuff Size Normal  Pulse Rate 50  Temp 97.2 F (36.2 C)  Weight 155 lb (70.3 kg)  Resp 18  SpO2 100 %    Alert, oriented in NAD No JVD Lungs clear Cor  -2/6 SEM, with radiation to the carotids.  I do not hear a significant diastolic blow.  Abd soft with HS megaly Ext no edema Neuro  -grossly intact.    ECG  - SB. RBBB. No significant change from tracing of 07/15/2023.   ECHO - See prior reports.  Mod AI, mild AS.  Thickened AV.  Preserved LV.   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 Monetta in [address])   Plan  I reviewed with the patient the Ocean A trial, and the status of current Lpa trials, and thanked him for his participation.  He will continue in follow up in the research clinic.   Debby BIRCH. Morris, MD, [facility] Medical Director, [facility]

## 2024-06-21 ENCOUNTER — Encounter: Payer: Self-pay | Admitting: Allergy and Immunology

## 2024-06-21 ENCOUNTER — Ambulatory Visit: Admitting: Allergy and Immunology

## 2024-06-21 VITALS — BP 146/76 | HR 64 | Resp 16

## 2024-06-21 DIAGNOSIS — J301 Allergic rhinitis due to pollen: Secondary | ICD-10-CM | POA: Diagnosis not present

## 2024-06-21 DIAGNOSIS — J3089 Other allergic rhinitis: Secondary | ICD-10-CM

## 2024-06-21 DIAGNOSIS — H6993 Unspecified Eustachian tube disorder, bilateral: Secondary | ICD-10-CM

## 2024-06-21 NOTE — Patient Instructions (Addendum)
°  1. Allergen avoidance measures - tree, grass, weed  2. Treat and prevent inflammation of upper airway / eustachian tubes:   A. Fluticasone  - 1 spray each nostril 1-2 times per day  B. Azelastine  - 1 spray each nostril 1-2 times per day  3. If needed:   A. Fexofenadine 180 - 1 tablet 1 time per day   4. Consider starting immunotherapy if medical therapy fails  5. Influenza = Tamiflu. Covid = Paxlovid  6. Return to clinic in 1 year or earlier if problem

## 2024-06-21 NOTE — Progress Notes (Signed)
 [place] - [place] - [place] - [place]   Follow-up Note  Referring Provider: [name] LABOR, MD Primary Provider: [name] LABOR, MD Date of Office Visit: 06/21/2024  Subjective:   Samuel [name] (DOB: 04-Oct-1955) is a 68 y.o. male who returns to the [facility]  and [facility] Center on 06/21/2024 in re-evaluation of the following:  HPI:Samuel [name] returns to this clinic in evaluation of upper airway and ear problems.  I last saw him in this clinic 14 June 2024.  He thinks that he is doing better at this point and has very little issue with his upper airway.  He has no nasal congestion or head pressure.  As well, his ears are doing very well and they do not feel full or have popping.  Allergies as of 06/21/2024       Reactions   Sulfa Antibiotics Other (See Comments)   Pt unable to recall reaction        Medication List    ALLEGRA PO Take 1 tablet by mouth daily.   aspirin  81 MG chewable tablet Chew 1 tablet (81 mg total) by mouth daily.   atorvastatin  80 MG tablet Commonly known as: LIPITOR  Take 1 tablet (80 mg total) by mouth 2 (two) times a week.   azelastine  0.1 % nasal spray Commonly known as: ASTELIN  Place 1 spray into both nostrils 2 (two) times daily.   CLEAR EYES FOR DRY EYES OP Apply 1 drop to eye daily as needed (dry eyes).   diclofenac 75 MG EC tablet Commonly known as: VOLTAREN Take 75 mg by mouth as needed for mild pain (pain score 1-3).   FLAXSEED OIL PO Take 1 capsule by mouth daily.   fluticasone  50 MCG/ACT nasal spray Commonly known as: FLONASE  Place 1 spray into both nostrils daily.   ibuprofen 200 MG tablet Commonly known as: ADVIL Take 200 mg by mouth every 6 (six) hours as needed for mild pain.   Iron 325 (65 Fe) MG Tabs Take 1 tablet by mouth 3 (three) times a week.   JOINT SUPPORT COMPLEX PO Take 1 tablet by mouth daily.   MENS 50+ MULTI VITAMIN/MIN PO Take 1 tablet by mouth daily.   metoprolol  tartrate 25 MG  tablet Commonly known as: LOPRESSOR  Take 0.5 tablets (12.5 mg total) by mouth 2 (two) times daily.   nitroGLYCERIN  0.4 MG SL tablet Commonly known as: NITROSTAT  Place 1 tablet (0.4 mg total) under the tongue every 5 (five) minutes as needed for chest pain.   NON FORMULARY Take 25 mg by mouth daily. CBD tablets   [name] olpasiran (AMG 890) or placebo 142 mg/mL SQ injection Inject 142 mg into the skin once. For Investigational Use Only. Inject 1 mL (1 prefilled syringe) subcutaneously into appropriate injection site per protocol every 12 weeks in clinic. (Approved injection site(s): upper arm, upper thigh & abdomen). Please contact  [facility] for Cardiology Research for any question or concerns regarding this medication.   OMEGA-3 FISH OIL PO Take 1 capsule by mouth daily.   Repatha  SureClick 140 MG/ML Soaj Generic drug: Evolocumab  ADMINISTER 1 ML UNDER THE SKIN EVERY 14 DAYS   timolol 0.25 % ophthalmic solution Commonly known as: TIMOPTIC Place 1 drop into both eyes 2 (two) times daily.    Past Medical History:  Diagnosis Date   Acute MI, anterior wall (HCC) 02/12/2019   Acute ST elevation myocardial infarction (STEMI) involving left anterior descending (LAD) coronary artery without development of Q waves (HCC) 02/12/2019  Acute ST elevation myocardial infarction (STEMI) of anterior wall (HCC) 02/12/2019   Aortic regurgitation 04/19/2019   CAD (coronary artery disease), native coronary artery    a. 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   Glaucoma    Hyperlipidemia LDL goal <70    Hyperlipidemia with target LDL less than 70 02/12/2019   Ischemic cardiomyopathy    LV dysfunction 02/20/2019   Seasonal allergies 02/14/2019    Past Surgical History:  Procedure Laterality Date   CARDIAC CATHETERIZATION     CATARACT EXTRACTION, BILATERAL Bilateral 10/2020    Glaucoma repair   CORONARY/GRAFT ACUTE MI REVASCULARIZATION N/A 02/12/2019   Procedure: Coronary/Graft Acute MI Revascularization;  Surgeon: Anner Alm ORN, MD;  Location: [facility] INVASIVE CV LAB;  Service: Cardiovascular;  Laterality: N/A;   HAND SURGERY     right   LEFT HEART CATH AND CORONARY ANGIOGRAPHY N/A 02/12/2019   Procedure: LEFT HEART CATH AND CORONARY ANGIOGRAPHY;  Surgeon: Anner Alm ORN, MD;  Location: [facility] INVASIVE CV LAB;  Service: Cardiovascular;  Laterality: N/A;    Review of systems negative except as noted in HPI / PMHx or noted below:  Review of Systems  Constitutional: Negative.   HENT: Negative.    Eyes: Negative.   Respiratory: Negative.    Cardiovascular: Negative.   Gastrointestinal: Negative.   Genitourinary: Negative.   Musculoskeletal: Negative.   Skin: Negative.   Neurological: Negative.   Endo/Heme/Allergies: Negative.   Psychiatric/Behavioral: Negative.       Objective:   Vitals:   06/21/24 1032 06/21/24 1049  BP: (!) 142/72 (!) 146/76  Pulse: 64   Resp: 16   SpO2: 98%           Physical Exam Constitutional:      Appearance: He is not diaphoretic.  HENT:     Head: Normocephalic.     Right Ear: Tympanic membrane, ear canal and external ear normal.     Left Ear: Tympanic membrane, ear canal and external ear normal.     Nose: Nose normal. No mucosal edema or rhinorrhea.     Mouth/Throat:     Pharynx: Uvula midline. No oropharyngeal exudate.  Eyes:     Conjunctiva/sclera: Conjunctivae normal.  Neck:     Thyroid: No thyromegaly.     Trachea: Trachea normal. No tracheal tenderness or tracheal deviation.  Cardiovascular:     Rate and Rhythm: Normal rate and regular rhythm.     Heart sounds: S1 normal and S2 normal. Murmur (systolic) heard.  Pulmonary:     Effort: No respiratory distress.     Breath sounds: Normal breath sounds. No stridor. No wheezing or rales.  Lymphadenopathy:     Head:     Right side of head: No tonsillar  adenopathy.     Left side of head: No tonsillar adenopathy.     Cervical: No cervical adenopathy.  Skin:    Findings: No erythema or rash.     Nails: There is no clubbing.  Neurological:     Mental Status: He is alert.     Diagnostics: none  Assessment and Plan:   1. Perennial allergic rhinitis   2. Seasonal allergic rhinitis due to pollen   3. Dysfunction of both eustachian tubes    1. Allergen avoidance measures - tree, grass, weed  2. Treat and prevent inflammation of upper airway / eustachian tubes:  A. Fluticasone  - 1 spray each nostril 1-2 times per day  B. Azelastine  - 1 spray each nostril 1-2 times per day  3. If needed:   A. Fexofenadine 180 - 1 tablet 1 time per day   4. Consider starting immunotherapy if medical therapy fails  5. Influenza = Tamiflu. Covid = Paxlovid  6. Return to clinic in 1 year or earlier if problem  Samuel [name] appears to be doing okay at this time of the year.  He will need to go through the pollination season and see if this plan works. He would definitely be a candidate for immunotherapy if he fails medical therapy.  I will see him back in this clinic in 1 year or earlier if there is a problem.  Camellia Denis, MD Allergy  / Immunology [facility]

## 2024-06-22 ENCOUNTER — Encounter: Payer: Self-pay | Admitting: Allergy and Immunology

## 2024-06-27 NOTE — Research (Addendum)
 Are there any labs that are clinically significant?  Yes []  OR No[x]   Please FORWARD back to me with any changes or follow up!   Samuel KYM Maxcy, MD, [facility], FNLA, FACP  [place]  [facility]  Medical Director of the [facility]  [facility] Diplomate of the American Board of Clinical Lipidology Attending Cardiologist  Direct Dial: [phone]  Fax: [phone]  Website:  [email or web address]    ACCESSION NO. [id number]                                             Page 1 of 1                                                        INVESTIGATOR: ([id number])                          PROTOCOL   [id number]                     Samuel [name] M.D.                             INVESTIGATOR NO.: [id number]                     c/o Reena Lies                              Samuel [name]: [phone]                     [facility]                 Samuel INITIALS NOT COLLECTED:                     [address] [place] [address]                 VISIT: [id number]                     [place], [place]  [id number]                   SPONSOR REPORT TO:                 COLLECTION TIME:08:43 DATE:15-Jun-2024                     Leora Shoe                      DATE RECEIVED IN LABORATORY: 16-Jun-2024                     c/o Sponsor(or Addtl) [employer].Study DATE REPORTED BY LABORATORY: 24-Jun-2024                     [facility]  SEX: M  BIRTHDATE:  11-Jan-1956    AGE: 31b                     [address]                  SELMA NO.: [phone]                   [place], [place]  [phone]                                                                                        Ref. Ranges               Clinical    Comments                                                                          Significance                                                                            Yes*  No                    DECREASE >/=  EGFR 50%                      eGFR > 50%     Criteria not met                                        EGFR                      CKDEPI eGF     80           mL/min/1.67m2                                                            No Ref Rng                      [employer]                      APTT           23.8         21.9-29.4 sec  PT             11.0         9.7-12.3 sec                                 INR            1.1          Patient not taking                                                       oral anticoagulant:                                                      0.8 - 1.2                                                                Patient taking                                                          oral anticoagulant:                                                      2.0 - 3.0                                  ALT & INR                      ALT & INR      Criteria not met                                        AST & INR                      AST & INR      Criteria not met                     ALT > 3 X ULN                      ALT>3XULN      Criteria not met  ALT > 5 X ULN                      ALT>5XULN      Criteria not met                                        ALT > 8 X ULN                      ALT>8XULN      Criteria not met                                        ALT & TBIL                      ALT & TBIL     Criteria not met                                        AST > 3 X ULN                      AST>3XULN      Criteria not met                                        AST > 5 X ULN                      AST>5XULN      Criteria not met                                        AST > 8 X ULN                      AST>8XULN      Criteria not met                                        AST & TBIL                      AST & TBIL     Criteria not met                       ANION  GAP                      Anion Gap      17           7-18 mEq/L                     FASTING GLUCOSE                      Glucose        107     H  70-100 mg/dL         98/97/7974 = 898                                 IS Samuel FASTING?                      Fasting?       Yes                       HBA1C                      Hgb A1c        5.7          <6.5 %                       SM [id number] ANTIBODY COLL D/T                      CDate PreD     15-Jun-2024                                               CTime PreD     08:43                                                   SM [id number] LPA COLLECTION D/T                      Coll Date      15-Jun-2024                                               Coll Time      08:43                     CHEMISTRY PANEL                      Total Bili     0.4          0.2-1.2 mg/dL                                D-BilGen2      0.18         0.00-0.36 mg/dL                              Ind Bili       0.2          0.0-1.2 mg/dL                                Alk Phos       68           40-129 U/L  ALT (SGPT)     23           5-48 U/L                                    AST (SGOT)     34           8-40 U/L                                    Urea Nitr      17           4-24 mg/dL                                   Creatinine     1.02         0.45-1.35 mg/dL                              Calcium         9.6          8.3-10.6 mg/dL                              Total Prot     7.3          6.0-8.0 g/dL                                 Alb BCG        4.7          3.3-4.9 g/dL                                 CK             314     H    39-308 U/L          12/30/2023 = 101                                        Sodium         141          135-145 mEq/L                                Potassium      4.1          3.5-5.2 mEq/L                                Bicarb         22.9         19.3-29.3 mEq/L                              Chloride        105  94-112 mEq/L                               ADJUSTED CALCIUM                       Adj Calc       9.6          8.3-10.6 mg/dL                       HEMATOLOGY&DIFFERENTIAL PANEL                      HGB            13.4         12.5-17.0 g/dL                              HCT            39           37-51 %                                      RBC            4.0          4.0-5.8 x106/uL                              MCH            33           26-34 pg                                    MCHC           35           31-38 g/dL                                   RDW            12.7         12.0-15.0 %                                 RBC Morph      No Review Required                                       MCV            97           80-100 fL                                    WBC            5.90         3.80-10.70 x103/uL  Neutrophil     3.21         1.96-7.23 x103/uL                           Lymphocyte     2.09         0.80-3.00 x103/uL                           Monocytes      0.42         0.12-0.92 x103/uL                           Eosinophil     0.15         0.00-0.57 x103/uL                           Basophils      0.04         0.00-0.20 x103/uL                           Neutrophil     54.5         40.5-75.0 %                                 Lymphocyte     35.3         15.4-48.5%                                   Monocytes      7.0          2.6-10.1 %                                   Eosinophil     2.6          0.0-6.8 %                                    Basophils      0.6          0.0-2.0 %                                    Platelets      221          130-394 x103/uL                            ANC                      ANC            3.21         1.96-7.23 x103/uL

## 2024-07-07 ENCOUNTER — Ambulatory Visit: Admitting: Cardiology

## 2024-07-21 ENCOUNTER — Other Ambulatory Visit: Payer: Self-pay | Admitting: Cardiology

## 2024-09-07 ENCOUNTER — Encounter

## 2024-09-13 ENCOUNTER — Ambulatory Visit: Admitting: Cardiology
